# Patient Record
Sex: Male | Born: 1940
Health system: Southern US, Community
[De-identification: ages and names within clinical notes are randomized; demographics above are authoritative.]

## PROBLEM LIST (undated history)

## (undated) DIAGNOSIS — I10 Essential (primary) hypertension: Secondary | ICD-10-CM

## (undated) DIAGNOSIS — E785 Hyperlipidemia, unspecified: Secondary | ICD-10-CM

## (undated) DIAGNOSIS — N500 Atrophy of testis: Secondary | ICD-10-CM

## (undated) DIAGNOSIS — C439 Malignant melanoma of skin, unspecified: Secondary | ICD-10-CM

## (undated) DIAGNOSIS — N200 Calculus of kidney: Secondary | ICD-10-CM

## (undated) DIAGNOSIS — K5790 Diverticulosis of intestine, part unspecified, without perforation or abscess without bleeding: Secondary | ICD-10-CM

## (undated) HISTORY — DX: Hyperlipidemia, unspecified: E78.5

## (undated) HISTORY — DX: Diverticulosis of intestine, part unspecified, without perforation or abscess without bleeding: K57.90

## (undated) HISTORY — DX: Calculus of kidney: N20.0

## (undated) HISTORY — DX: Malignant melanoma of skin, unspecified: C43.9

## (undated) HISTORY — PX: APPENDECTOMY: SHX54

## (undated) HISTORY — DX: Atrophy of testis: N50.0

## (undated) HISTORY — DX: Essential (primary) hypertension: I10

---

## 1992-03-27 DIAGNOSIS — C439 Malignant melanoma of skin, unspecified: Secondary | ICD-10-CM

## 1992-03-27 HISTORY — DX: Malignant melanoma of skin, unspecified: C43.9

## 1992-08-08 DIAGNOSIS — C4491 Basal cell carcinoma of skin, unspecified: Secondary | ICD-10-CM

## 1992-08-08 HISTORY — DX: Basal cell carcinoma of skin, unspecified: C44.91

## 1996-05-13 DIAGNOSIS — C44619 Basal cell carcinoma of skin of left upper limb, including shoulder: Secondary | ICD-10-CM

## 1996-05-13 HISTORY — DX: Basal cell carcinoma of skin of left upper limb, including shoulder: C44.619

## 1997-06-28 DIAGNOSIS — C4492 Squamous cell carcinoma of skin, unspecified: Secondary | ICD-10-CM

## 1997-06-28 DIAGNOSIS — C4490 Unspecified malignant neoplasm of skin, unspecified: Secondary | ICD-10-CM

## 1997-06-28 HISTORY — DX: Squamous cell carcinoma of skin, unspecified: C44.92

## 1997-06-28 HISTORY — DX: Unspecified malignant neoplasm of skin, unspecified: C44.90

## 1997-10-27 DIAGNOSIS — C4491 Basal cell carcinoma of skin, unspecified: Secondary | ICD-10-CM

## 1997-10-27 HISTORY — DX: Basal cell carcinoma of skin, unspecified: C44.91

## 1999-06-21 ENCOUNTER — Ambulatory Visit (HOSPITAL_COMMUNITY): Admission: RE | Admit: 1999-06-21 | Discharge: 1999-06-21 | Payer: Self-pay | Admitting: Gastroenterology

## 2000-03-04 DIAGNOSIS — C44519 Basal cell carcinoma of skin of other part of trunk: Secondary | ICD-10-CM

## 2000-03-04 HISTORY — DX: Basal cell carcinoma of skin of other part of trunk: C44.519

## 2003-07-25 ENCOUNTER — Emergency Department (HOSPITAL_COMMUNITY): Admission: EM | Admit: 2003-07-25 | Discharge: 2003-07-25 | Payer: Self-pay | Admitting: Emergency Medicine

## 2003-08-10 ENCOUNTER — Ambulatory Visit (HOSPITAL_COMMUNITY): Admission: RE | Admit: 2003-08-10 | Discharge: 2003-08-10 | Payer: Self-pay | Admitting: Cardiology

## 2004-01-04 DIAGNOSIS — D229 Melanocytic nevi, unspecified: Secondary | ICD-10-CM

## 2004-01-04 HISTORY — DX: Melanocytic nevi, unspecified: D22.9

## 2009-06-28 ENCOUNTER — Encounter: Admission: RE | Admit: 2009-06-28 | Discharge: 2009-06-28 | Payer: Self-pay | Admitting: Otolaryngology

## 2010-06-15 NOTE — Procedures (Signed)
[place]. [facility]  Patient:    Jaime Waters, Jaime Waters                MRN: [id number] Proc. Date: 06/21/99 Adm. Date:  [id number] Disc. Date: [id number] Attending:  Orland Mustard CC:         Jamesetta Geralds, M.D.                           Procedure Report  PROCEDURE:  Colonoscopy.  MEDICATIONS:  Fentanyl 85 mcg and Versed 7 mg IV.  INDICATIONS FOR PROCEDURE:  Heme positive stools in a 70 year old gentleman.  DESCRIPTION OF PROCEDURE:  The procedure had been explained to the patient and consent obtained.  With the patient in the left lateral decubitus position, the adult Olympus video colonoscope was inserted and advanced under direct visualization.  The prep was quite good.  Using a small amount of abdominal pressure, we were able to advance rapidly to the cecum.  The ileocecal valve and appendiceal orifice were identified.  The scope was withdrawn.  the cecum, ascending colon, hepatic flexure, transverse colon, splenic flexure, descending and sigmoid colon were seen well upon removal.  No polyps were seen.  Moderate diverticulosis was seen in the sigmoid colon.  The scope was withdrawn down to the rectum.  No polyps were seen in the rectum.  The patient did have small internal hemorrhoids.  The patient tolerated the procedure well, was maintained on low flow oxygen and pulse oximeter throughout the procedure with no obvious problems.  ASSESSMENT: 1. Heme positive stool with no obvious cause on colonoscopy. 2. Diverticulosis. 3. Internal hemorrhoids.  PLAN:  Will follow back in the office in 3-4 weeks and recheck his stool. DD:  06/21/99 TD:  06/25/99 Job: [id number]

## 2012-06-23 DIAGNOSIS — N500 Atrophy of testis: Secondary | ICD-10-CM | POA: Diagnosis not present

## 2012-08-13 DIAGNOSIS — H35379 Puckering of macula, unspecified eye: Secondary | ICD-10-CM | POA: Diagnosis not present

## 2012-08-18 DIAGNOSIS — L570 Actinic keratosis: Secondary | ICD-10-CM | POA: Diagnosis not present

## 2012-08-18 DIAGNOSIS — B079 Viral wart, unspecified: Secondary | ICD-10-CM | POA: Diagnosis not present

## 2012-08-18 DIAGNOSIS — D485 Neoplasm of uncertain behavior of skin: Secondary | ICD-10-CM | POA: Diagnosis not present

## 2012-08-19 DIAGNOSIS — N402 Nodular prostate without lower urinary tract symptoms: Secondary | ICD-10-CM | POA: Diagnosis not present

## 2012-08-26 DIAGNOSIS — N500 Atrophy of testis: Secondary | ICD-10-CM | POA: Diagnosis not present

## 2012-08-26 DIAGNOSIS — N529 Male erectile dysfunction, unspecified: Secondary | ICD-10-CM | POA: Diagnosis not present

## 2012-08-26 DIAGNOSIS — N402 Nodular prostate without lower urinary tract symptoms: Secondary | ICD-10-CM | POA: Diagnosis not present

## 2012-08-26 DIAGNOSIS — N400 Enlarged prostate without lower urinary tract symptoms: Secondary | ICD-10-CM | POA: Diagnosis not present

## 2012-10-18 DIAGNOSIS — S82009A Unspecified fracture of unspecified patella, initial encounter for closed fracture: Secondary | ICD-10-CM | POA: Diagnosis not present

## 2012-10-19 DIAGNOSIS — M25569 Pain in unspecified knee: Secondary | ICD-10-CM | POA: Diagnosis not present

## 2012-10-19 DIAGNOSIS — S82009A Unspecified fracture of unspecified patella, initial encounter for closed fracture: Secondary | ICD-10-CM | POA: Diagnosis not present

## 2012-10-28 DIAGNOSIS — S82009A Unspecified fracture of unspecified patella, initial encounter for closed fracture: Secondary | ICD-10-CM | POA: Diagnosis not present

## 2012-11-10 DIAGNOSIS — Z23 Encounter for immunization: Secondary | ICD-10-CM | POA: Diagnosis not present

## 2012-11-11 DIAGNOSIS — S82009A Unspecified fracture of unspecified patella, initial encounter for closed fracture: Secondary | ICD-10-CM | POA: Diagnosis not present

## 2012-11-27 DIAGNOSIS — S2341XA Sprain of ribs, initial encounter: Secondary | ICD-10-CM | POA: Diagnosis not present

## 2012-11-27 DIAGNOSIS — S2239XA Fracture of one rib, unspecified side, initial encounter for closed fracture: Secondary | ICD-10-CM | POA: Diagnosis not present

## 2012-12-09 DIAGNOSIS — S82009A Unspecified fracture of unspecified patella, initial encounter for closed fracture: Secondary | ICD-10-CM | POA: Diagnosis not present

## 2012-12-15 DIAGNOSIS — M25569 Pain in unspecified knee: Secondary | ICD-10-CM | POA: Diagnosis not present

## 2012-12-15 DIAGNOSIS — S82009A Unspecified fracture of unspecified patella, initial encounter for closed fracture: Secondary | ICD-10-CM | POA: Diagnosis not present

## 2012-12-16 DIAGNOSIS — L570 Actinic keratosis: Secondary | ICD-10-CM | POA: Diagnosis not present

## 2012-12-16 DIAGNOSIS — D239 Other benign neoplasm of skin, unspecified: Secondary | ICD-10-CM | POA: Diagnosis not present

## 2012-12-17 DIAGNOSIS — M25569 Pain in unspecified knee: Secondary | ICD-10-CM | POA: Diagnosis not present

## 2012-12-17 DIAGNOSIS — S82009A Unspecified fracture of unspecified patella, initial encounter for closed fracture: Secondary | ICD-10-CM | POA: Diagnosis not present

## 2012-12-21 DIAGNOSIS — S82009A Unspecified fracture of unspecified patella, initial encounter for closed fracture: Secondary | ICD-10-CM | POA: Diagnosis not present

## 2012-12-21 DIAGNOSIS — M25569 Pain in unspecified knee: Secondary | ICD-10-CM | POA: Diagnosis not present

## 2012-12-30 DIAGNOSIS — M25569 Pain in unspecified knee: Secondary | ICD-10-CM | POA: Diagnosis not present

## 2013-01-18 DIAGNOSIS — S2239XA Fracture of one rib, unspecified side, initial encounter for closed fracture: Secondary | ICD-10-CM | POA: Diagnosis not present

## 2013-03-30 ENCOUNTER — Other Ambulatory Visit: Payer: Self-pay | Admitting: Family Medicine

## 2013-03-30 DIAGNOSIS — E7800 Pure hypercholesterolemia, unspecified: Secondary | ICD-10-CM | POA: Diagnosis not present

## 2013-03-30 DIAGNOSIS — E291 Testicular hypofunction: Secondary | ICD-10-CM | POA: Diagnosis not present

## 2013-03-30 DIAGNOSIS — I10 Essential (primary) hypertension: Secondary | ICD-10-CM | POA: Diagnosis not present

## 2013-03-30 DIAGNOSIS — Z23 Encounter for immunization: Secondary | ICD-10-CM | POA: Diagnosis not present

## 2013-03-30 DIAGNOSIS — Z0000 Encounter for general adult medical examination without abnormal findings: Secondary | ICD-10-CM | POA: Diagnosis not present

## 2013-03-30 DIAGNOSIS — S2239XA Fracture of one rib, unspecified side, initial encounter for closed fracture: Secondary | ICD-10-CM

## 2013-04-08 DIAGNOSIS — D0439 Carcinoma in situ of skin of other parts of face: Secondary | ICD-10-CM | POA: Diagnosis not present

## 2013-04-08 DIAGNOSIS — L820 Inflamed seborrheic keratosis: Secondary | ICD-10-CM | POA: Diagnosis not present

## 2013-04-08 DIAGNOSIS — D485 Neoplasm of uncertain behavior of skin: Secondary | ICD-10-CM | POA: Diagnosis not present

## 2013-04-08 DIAGNOSIS — D0430 Carcinoma in situ of skin of unspecified part of face: Secondary | ICD-10-CM | POA: Diagnosis not present

## 2013-04-08 DIAGNOSIS — C44529 Squamous cell carcinoma of skin of other part of trunk: Secondary | ICD-10-CM | POA: Diagnosis not present

## 2013-04-08 DIAGNOSIS — L570 Actinic keratosis: Secondary | ICD-10-CM | POA: Diagnosis not present

## 2013-04-16 ENCOUNTER — Ambulatory Visit
Admission: RE | Admit: 2013-04-16 | Discharge: 2013-04-16 | Disposition: A | Discharge status: Home / Self Care | Payer: Medicare Other | Source: Ambulatory Visit | Attending: Family Medicine | Admitting: Family Medicine

## 2013-04-16 DIAGNOSIS — M899 Disorder of bone, unspecified: Secondary | ICD-10-CM | POA: Diagnosis not present

## 2013-04-16 DIAGNOSIS — S2239XA Fracture of one rib, unspecified side, initial encounter for closed fracture: Secondary | ICD-10-CM

## 2013-05-13 DIAGNOSIS — D0430 Carcinoma in situ of skin of unspecified part of face: Secondary | ICD-10-CM | POA: Diagnosis not present

## 2013-05-13 DIAGNOSIS — D0439 Carcinoma in situ of skin of other parts of face: Secondary | ICD-10-CM | POA: Diagnosis not present

## 2013-05-13 DIAGNOSIS — D485 Neoplasm of uncertain behavior of skin: Secondary | ICD-10-CM | POA: Diagnosis not present

## 2013-05-13 DIAGNOSIS — C44529 Squamous cell carcinoma of skin of other part of trunk: Secondary | ICD-10-CM | POA: Diagnosis not present

## 2013-05-13 DIAGNOSIS — L820 Inflamed seborrheic keratosis: Secondary | ICD-10-CM | POA: Diagnosis not present

## 2013-05-13 DIAGNOSIS — L570 Actinic keratosis: Secondary | ICD-10-CM | POA: Diagnosis not present

## 2013-05-24 DIAGNOSIS — N400 Enlarged prostate without lower urinary tract symptoms: Secondary | ICD-10-CM | POA: Diagnosis not present

## 2013-05-31 DIAGNOSIS — N400 Enlarged prostate without lower urinary tract symptoms: Secondary | ICD-10-CM | POA: Diagnosis not present

## 2013-05-31 DIAGNOSIS — N402 Nodular prostate without lower urinary tract symptoms: Secondary | ICD-10-CM | POA: Diagnosis not present

## 2013-05-31 DIAGNOSIS — N529 Male erectile dysfunction, unspecified: Secondary | ICD-10-CM | POA: Diagnosis not present

## 2013-08-16 DIAGNOSIS — H43819 Vitreous degeneration, unspecified eye: Secondary | ICD-10-CM | POA: Diagnosis not present

## 2013-08-30 DIAGNOSIS — M25519 Pain in unspecified shoulder: Secondary | ICD-10-CM | POA: Diagnosis not present

## 2013-08-30 DIAGNOSIS — M25549 Pain in joints of unspecified hand: Secondary | ICD-10-CM | POA: Diagnosis not present

## 2013-08-30 DIAGNOSIS — S42023A Displaced fracture of shaft of unspecified clavicle, initial encounter for closed fracture: Secondary | ICD-10-CM | POA: Diagnosis not present

## 2013-09-01 DIAGNOSIS — L089 Local infection of the skin and subcutaneous tissue, unspecified: Secondary | ICD-10-CM | POA: Diagnosis not present

## 2013-09-01 DIAGNOSIS — L259 Unspecified contact dermatitis, unspecified cause: Secondary | ICD-10-CM | POA: Diagnosis not present

## 2013-09-10 DIAGNOSIS — M25519 Pain in unspecified shoulder: Secondary | ICD-10-CM | POA: Diagnosis not present

## 2013-10-01 DIAGNOSIS — M25539 Pain in unspecified wrist: Secondary | ICD-10-CM | POA: Diagnosis not present

## 2013-10-01 DIAGNOSIS — M19039 Primary osteoarthritis, unspecified wrist: Secondary | ICD-10-CM | POA: Diagnosis not present

## 2013-10-01 DIAGNOSIS — M25519 Pain in unspecified shoulder: Secondary | ICD-10-CM | POA: Diagnosis not present

## 2013-10-15 DIAGNOSIS — M25519 Pain in unspecified shoulder: Secondary | ICD-10-CM | POA: Diagnosis not present

## 2013-10-18 DIAGNOSIS — Z23 Encounter for immunization: Secondary | ICD-10-CM | POA: Diagnosis not present

## 2013-10-18 DIAGNOSIS — I10 Essential (primary) hypertension: Secondary | ICD-10-CM | POA: Diagnosis not present

## 2013-10-19 DIAGNOSIS — M25519 Pain in unspecified shoulder: Secondary | ICD-10-CM | POA: Diagnosis not present

## 2013-10-21 DIAGNOSIS — M25519 Pain in unspecified shoulder: Secondary | ICD-10-CM | POA: Diagnosis not present

## 2013-10-26 DIAGNOSIS — M25519 Pain in unspecified shoulder: Secondary | ICD-10-CM | POA: Diagnosis not present

## 2013-10-28 DIAGNOSIS — M25511 Pain in right shoulder: Secondary | ICD-10-CM | POA: Diagnosis not present

## 2013-10-29 DIAGNOSIS — S42024D Nondisplaced fracture of shaft of right clavicle, subsequent encounter for fracture with routine healing: Secondary | ICD-10-CM | POA: Diagnosis not present

## 2013-10-29 DIAGNOSIS — M19031 Primary osteoarthritis, right wrist: Secondary | ICD-10-CM | POA: Diagnosis not present

## 2013-10-29 DIAGNOSIS — M25511 Pain in right shoulder: Secondary | ICD-10-CM | POA: Diagnosis not present

## 2013-11-02 DIAGNOSIS — M25511 Pain in right shoulder: Secondary | ICD-10-CM | POA: Diagnosis not present

## 2013-11-04 DIAGNOSIS — M25511 Pain in right shoulder: Secondary | ICD-10-CM | POA: Diagnosis not present

## 2013-11-09 DIAGNOSIS — M25511 Pain in right shoulder: Secondary | ICD-10-CM | POA: Diagnosis not present

## 2013-11-11 DIAGNOSIS — M25511 Pain in right shoulder: Secondary | ICD-10-CM | POA: Diagnosis not present

## 2013-11-16 DIAGNOSIS — M25511 Pain in right shoulder: Secondary | ICD-10-CM | POA: Diagnosis not present

## 2013-11-18 DIAGNOSIS — M25511 Pain in right shoulder: Secondary | ICD-10-CM | POA: Diagnosis not present

## 2013-11-23 DIAGNOSIS — M25511 Pain in right shoulder: Secondary | ICD-10-CM | POA: Diagnosis not present

## 2013-11-25 DIAGNOSIS — M25511 Pain in right shoulder: Secondary | ICD-10-CM | POA: Diagnosis not present

## 2013-11-29 DIAGNOSIS — S42024D Nondisplaced fracture of shaft of right clavicle, subsequent encounter for fracture with routine healing: Secondary | ICD-10-CM | POA: Diagnosis not present

## 2013-11-29 DIAGNOSIS — M19031 Primary osteoarthritis, right wrist: Secondary | ICD-10-CM | POA: Diagnosis not present

## 2013-11-30 DIAGNOSIS — M25511 Pain in right shoulder: Secondary | ICD-10-CM | POA: Diagnosis not present

## 2013-12-02 DIAGNOSIS — M25511 Pain in right shoulder: Secondary | ICD-10-CM | POA: Diagnosis not present

## 2013-12-07 DIAGNOSIS — M25511 Pain in right shoulder: Secondary | ICD-10-CM | POA: Diagnosis not present

## 2013-12-09 DIAGNOSIS — M25511 Pain in right shoulder: Secondary | ICD-10-CM | POA: Diagnosis not present

## 2013-12-14 DIAGNOSIS — M25511 Pain in right shoulder: Secondary | ICD-10-CM | POA: Diagnosis not present

## 2013-12-16 DIAGNOSIS — M25511 Pain in right shoulder: Secondary | ICD-10-CM | POA: Diagnosis not present

## 2013-12-20 DIAGNOSIS — M25511 Pain in right shoulder: Secondary | ICD-10-CM | POA: Diagnosis not present

## 2013-12-21 DIAGNOSIS — M25511 Pain in right shoulder: Secondary | ICD-10-CM | POA: Diagnosis not present

## 2013-12-28 DIAGNOSIS — M25511 Pain in right shoulder: Secondary | ICD-10-CM | POA: Diagnosis not present

## 2013-12-30 DIAGNOSIS — M25511 Pain in right shoulder: Secondary | ICD-10-CM | POA: Diagnosis not present

## 2014-01-03 DIAGNOSIS — L570 Actinic keratosis: Secondary | ICD-10-CM | POA: Diagnosis not present

## 2014-01-03 DIAGNOSIS — D485 Neoplasm of uncertain behavior of skin: Secondary | ICD-10-CM | POA: Diagnosis not present

## 2014-01-03 DIAGNOSIS — D225 Melanocytic nevi of trunk: Secondary | ICD-10-CM | POA: Diagnosis not present

## 2014-01-04 DIAGNOSIS — M25511 Pain in right shoulder: Secondary | ICD-10-CM | POA: Diagnosis not present

## 2014-01-05 DIAGNOSIS — M25511 Pain in right shoulder: Secondary | ICD-10-CM | POA: Diagnosis not present

## 2014-01-05 DIAGNOSIS — M19031 Primary osteoarthritis, right wrist: Secondary | ICD-10-CM | POA: Diagnosis not present

## 2014-01-06 DIAGNOSIS — M25511 Pain in right shoulder: Secondary | ICD-10-CM | POA: Diagnosis not present

## 2014-01-11 DIAGNOSIS — M25511 Pain in right shoulder: Secondary | ICD-10-CM | POA: Diagnosis not present

## 2014-01-13 DIAGNOSIS — M25511 Pain in right shoulder: Secondary | ICD-10-CM | POA: Diagnosis not present

## 2014-01-18 DIAGNOSIS — M25511 Pain in right shoulder: Secondary | ICD-10-CM | POA: Diagnosis not present

## 2014-01-20 DIAGNOSIS — M25511 Pain in right shoulder: Secondary | ICD-10-CM | POA: Diagnosis not present

## 2014-04-01 DIAGNOSIS — L97919 Non-pressure chronic ulcer of unspecified part of right lower leg with unspecified severity: Secondary | ICD-10-CM | POA: Diagnosis not present

## 2014-04-01 DIAGNOSIS — D485 Neoplasm of uncertain behavior of skin: Secondary | ICD-10-CM | POA: Diagnosis not present

## 2014-04-05 DIAGNOSIS — Z136 Encounter for screening for cardiovascular disorders: Secondary | ICD-10-CM | POA: Diagnosis not present

## 2014-04-05 DIAGNOSIS — Z0000 Encounter for general adult medical examination without abnormal findings: Secondary | ICD-10-CM | POA: Diagnosis not present

## 2014-04-05 DIAGNOSIS — I10 Essential (primary) hypertension: Secondary | ICD-10-CM | POA: Diagnosis not present

## 2014-05-30 DIAGNOSIS — N400 Enlarged prostate without lower urinary tract symptoms: Secondary | ICD-10-CM | POA: Diagnosis not present

## 2014-06-01 DIAGNOSIS — N402 Nodular prostate without lower urinary tract symptoms: Secondary | ICD-10-CM | POA: Diagnosis not present

## 2014-06-01 DIAGNOSIS — N5201 Erectile dysfunction due to arterial insufficiency: Secondary | ICD-10-CM | POA: Diagnosis not present

## 2014-08-22 DIAGNOSIS — H35372 Puckering of macula, left eye: Secondary | ICD-10-CM | POA: Diagnosis not present

## 2014-09-28 DIAGNOSIS — D485 Neoplasm of uncertain behavior of skin: Secondary | ICD-10-CM | POA: Diagnosis not present

## 2014-09-28 DIAGNOSIS — D045 Carcinoma in situ of skin of trunk: Secondary | ICD-10-CM | POA: Diagnosis not present

## 2014-09-28 DIAGNOSIS — L570 Actinic keratosis: Secondary | ICD-10-CM | POA: Diagnosis not present

## 2014-10-20 DIAGNOSIS — D045 Carcinoma in situ of skin of trunk: Secondary | ICD-10-CM | POA: Diagnosis not present

## 2014-11-11 DIAGNOSIS — Z23 Encounter for immunization: Secondary | ICD-10-CM | POA: Diagnosis not present

## 2015-01-10 DIAGNOSIS — D0472 Carcinoma in situ of skin of left lower limb, including hip: Secondary | ICD-10-CM | POA: Diagnosis not present

## 2015-01-10 DIAGNOSIS — D0460 Carcinoma in situ of skin of unspecified upper limb, including shoulder: Secondary | ICD-10-CM | POA: Diagnosis not present

## 2015-01-10 DIAGNOSIS — D0462 Carcinoma in situ of skin of left upper limb, including shoulder: Secondary | ICD-10-CM | POA: Diagnosis not present

## 2015-01-10 DIAGNOSIS — L570 Actinic keratosis: Secondary | ICD-10-CM | POA: Diagnosis not present

## 2015-01-10 DIAGNOSIS — D0470 Carcinoma in situ of skin of unspecified lower limb, including hip: Secondary | ICD-10-CM | POA: Diagnosis not present

## 2015-02-24 DIAGNOSIS — M542 Cervicalgia: Secondary | ICD-10-CM | POA: Diagnosis not present

## 2015-02-28 DIAGNOSIS — M5013 Cervical disc disorder with radiculopathy, cervicothoracic region: Secondary | ICD-10-CM | POA: Diagnosis not present

## 2015-02-28 DIAGNOSIS — M542 Cervicalgia: Secondary | ICD-10-CM | POA: Diagnosis not present

## 2015-03-03 DIAGNOSIS — H6691 Otitis media, unspecified, right ear: Secondary | ICD-10-CM | POA: Diagnosis not present

## 2015-03-03 DIAGNOSIS — M542 Cervicalgia: Secondary | ICD-10-CM | POA: Diagnosis not present

## 2015-03-03 DIAGNOSIS — I10 Essential (primary) hypertension: Secondary | ICD-10-CM | POA: Diagnosis not present

## 2015-03-03 DIAGNOSIS — M5013 Cervical disc disorder with radiculopathy, cervicothoracic region: Secondary | ICD-10-CM | POA: Diagnosis not present

## 2015-03-03 DIAGNOSIS — J209 Acute bronchitis, unspecified: Secondary | ICD-10-CM | POA: Diagnosis not present

## 2015-03-29 DIAGNOSIS — M5013 Cervical disc disorder with radiculopathy, cervicothoracic region: Secondary | ICD-10-CM | POA: Diagnosis not present

## 2015-03-29 DIAGNOSIS — M542 Cervicalgia: Secondary | ICD-10-CM | POA: Diagnosis not present

## 2015-03-31 DIAGNOSIS — M5013 Cervical disc disorder with radiculopathy, cervicothoracic region: Secondary | ICD-10-CM | POA: Diagnosis not present

## 2015-04-12 DIAGNOSIS — Z0000 Encounter for general adult medical examination without abnormal findings: Secondary | ICD-10-CM | POA: Diagnosis not present

## 2015-04-12 DIAGNOSIS — E7800 Pure hypercholesterolemia, unspecified: Secondary | ICD-10-CM | POA: Diagnosis not present

## 2015-04-12 DIAGNOSIS — I10 Essential (primary) hypertension: Secondary | ICD-10-CM | POA: Diagnosis not present

## 2015-06-08 DIAGNOSIS — D0472 Carcinoma in situ of skin of left lower limb, including hip: Secondary | ICD-10-CM | POA: Diagnosis not present

## 2015-06-08 DIAGNOSIS — C4442 Squamous cell carcinoma of skin of scalp and neck: Secondary | ICD-10-CM | POA: Diagnosis not present

## 2015-06-08 DIAGNOSIS — N402 Nodular prostate without lower urinary tract symptoms: Secondary | ICD-10-CM | POA: Diagnosis not present

## 2015-06-08 DIAGNOSIS — D044 Carcinoma in situ of skin of scalp and neck: Secondary | ICD-10-CM | POA: Diagnosis not present

## 2015-06-08 DIAGNOSIS — D0471 Carcinoma in situ of skin of right lower limb, including hip: Secondary | ICD-10-CM | POA: Diagnosis not present

## 2015-06-08 DIAGNOSIS — L570 Actinic keratosis: Secondary | ICD-10-CM | POA: Diagnosis not present

## 2015-06-16 DIAGNOSIS — R3129 Other microscopic hematuria: Secondary | ICD-10-CM | POA: Diagnosis not present

## 2015-06-16 DIAGNOSIS — Z0000 Encounter for general adult medical examination without abnormal findings: Secondary | ICD-10-CM | POA: Diagnosis not present

## 2015-06-16 DIAGNOSIS — N400 Enlarged prostate without lower urinary tract symptoms: Secondary | ICD-10-CM | POA: Diagnosis not present

## 2015-06-16 DIAGNOSIS — N390 Urinary tract infection, site not specified: Secondary | ICD-10-CM | POA: Diagnosis not present

## 2015-06-22 DIAGNOSIS — N200 Calculus of kidney: Secondary | ICD-10-CM | POA: Diagnosis not present

## 2015-06-22 DIAGNOSIS — R3129 Other microscopic hematuria: Secondary | ICD-10-CM | POA: Diagnosis not present

## 2015-07-13 DIAGNOSIS — C44529 Squamous cell carcinoma of skin of other part of trunk: Secondary | ICD-10-CM | POA: Diagnosis not present

## 2015-07-13 DIAGNOSIS — C4442 Squamous cell carcinoma of skin of scalp and neck: Secondary | ICD-10-CM | POA: Diagnosis not present

## 2015-07-13 DIAGNOSIS — D044 Carcinoma in situ of skin of scalp and neck: Secondary | ICD-10-CM | POA: Diagnosis not present

## 2015-07-13 DIAGNOSIS — C44329 Squamous cell carcinoma of skin of other parts of face: Secondary | ICD-10-CM | POA: Diagnosis not present

## 2015-08-09 DIAGNOSIS — N402 Nodular prostate without lower urinary tract symptoms: Secondary | ICD-10-CM | POA: Diagnosis not present

## 2015-08-09 DIAGNOSIS — R311 Benign essential microscopic hematuria: Secondary | ICD-10-CM | POA: Diagnosis not present

## 2015-08-09 DIAGNOSIS — N500 Atrophy of testis: Secondary | ICD-10-CM | POA: Diagnosis not present

## 2015-08-22 DIAGNOSIS — H2513 Age-related nuclear cataract, bilateral: Secondary | ICD-10-CM | POA: Diagnosis not present

## 2015-09-07 ENCOUNTER — Encounter: Payer: Self-pay | Admitting: Cardiology

## 2015-09-07 ENCOUNTER — Encounter (INDEPENDENT_AMBULATORY_CARE_PROVIDER_SITE_OTHER): Payer: Self-pay

## 2015-09-07 ENCOUNTER — Ambulatory Visit (INDEPENDENT_AMBULATORY_CARE_PROVIDER_SITE_OTHER): Payer: Medicare Other | Admitting: Cardiology

## 2015-09-07 VITALS — BP 124/72 | HR 75 | Ht 66.0 in | Wt 123.6 lb

## 2015-09-07 DIAGNOSIS — E785 Hyperlipidemia, unspecified: Secondary | ICD-10-CM | POA: Insufficient documentation

## 2015-09-07 DIAGNOSIS — R0789 Other chest pain: Secondary | ICD-10-CM | POA: Diagnosis not present

## 2015-09-07 DIAGNOSIS — C439 Malignant melanoma of skin, unspecified: Secondary | ICD-10-CM | POA: Insufficient documentation

## 2015-09-07 DIAGNOSIS — N500 Atrophy of testis: Secondary | ICD-10-CM | POA: Insufficient documentation

## 2015-09-07 DIAGNOSIS — R079 Chest pain, unspecified: Secondary | ICD-10-CM | POA: Diagnosis not present

## 2015-09-07 DIAGNOSIS — I10 Essential (primary) hypertension: Secondary | ICD-10-CM

## 2015-09-07 DIAGNOSIS — N200 Calculus of kidney: Secondary | ICD-10-CM | POA: Insufficient documentation

## 2015-09-07 DIAGNOSIS — K5790 Diverticulosis of intestine, part unspecified, without perforation or abscess without bleeding: Secondary | ICD-10-CM | POA: Insufficient documentation

## 2015-09-07 LAB — TROPONIN I

## 2015-09-07 MED ORDER — PANTOPRAZOLE SODIUM 40 MG PO TBEC: 40.0000 mg | DELAYED_RELEASE_TABLET | Freq: Every day | ORAL | 11 refills | Status: DC

## 2015-09-07 NOTE — Patient Instructions (Addendum)
Medication Instructions:  1) START PROTONIX 40 mg daily  Labwork: TODAY: TROPONIN  Testing/Procedures: Your physician has requested that you have an echocardiogram. Echocardiography is a painless test that uses sound waves to create images of your heart. It provides your doctor with information about the size and shape of your heart and how well your heart's chambers and valves are working. This procedure takes approximately one hour. There are no restrictions for this procedure.  Dr. Radford Pax recommends you have a NUCLEAR STRESS TEST.  Follow-Up: Your physician recommends that you schedule a follow-up appointment in 2 weeks with Dr. Theodosia Blender PA.  Any Other Special Instructions Will Be Listed Below (If Applicable).     If you need a refill on your cardiac medications before your next appointment, please call your pharmacy.

## 2015-09-07 NOTE — Progress Notes (Addendum)
Cardiology Office Note    Date:  09/07/2015   ID:  Jaime Waters, DOB [birth year], MRN [id number]  PCP:  No PCP Per Patient  Cardiologist:  Fransico Him, MD   Chief Complaint  Patient presents with  . Chest Pain  . Hypertension    History of Present Illness:  Jaime Waters is a 75 y.o. male who presents toady for evaluation of atypical chest pain.  He says that he was out playing golf and started having a feeling of a knot in the epigastrium that was constant.  The next day it was still present but then had moved into the left side of his chest. He has a hard time describing the sensation but says that it feels tight and feels like someone is pushing on his abdomen and chest.  He rides the elliptical for 6 miles daily without any change in the chest discomfort.  He denies any diaphoresis, SOB or nausea.  He has no DOE.  He started yesterday taking 8 gaviscon and 2 today but he doesn't think it helped any.  He has been belching a lot and momentarily will take the discomfort away.  He says that he feels like he has a lot of gas.  He denies any palpitations, LE edema, or dizziness.  He denies any tobacco use and no family history of CAD.      Past Medical History:  Diagnosis Date  . Benign essential HTN   . Diverticulosis   . Hyperlipidemia   . Kidney stone   . Melanoma ([place])   . Testicular atrophy     Past Surgical History:  Procedure Laterality Date  . APPENDECTOMY      Current Medications: No outpatient prescriptions prior to visit.   No facility-administered medications prior to visit.      Allergies:   Review of patient's allergies indicates not on file.   Social History   Social History  . Marital status: Married    Spouse name: N/A  . Number of children: N/A  . Years of education: N/A   Social History Main Topics  . Smoking status: Never Smoker  . Smokeless tobacco: None  . Alcohol use 1.8 oz/week    3 Glasses of wine per week  . Drug use:  Unknown  . Sexual activity: Not Asked   Other Topics Concern  . None   Social History Narrative  . None     Family History:  The patient's family history includes Dementia in his mother; Hypertension in his father; Pancreatic cancer in his father.   ROS:   Please see the history of present illness.    ROS All other systems reviewed and are negative.   PHYSICAL EXAM:   VS:  BP 124/72   Pulse 75   Ht 5\' 6"  (1.676 m)   Wt 123 lb 9.6 oz (56.1 kg)   SpO2 98%   BMI 19.95 kg/m    GEN: Well nourished, well developed, in no acute distress  HEENT: normal  Neck: no JVD, carotid bruits, or masses Cardiac: RRR; no murmurs, rubs, or gallops,no edema.  Intact distal pulses bilaterally.  Respiratory:  clear to auscultation bilaterally, normal work of breathing GI: soft, nontender, nondistended, + BS MS: no deformity or atrophy  Skin: warm and dry, no rash Neuro:  Alert and Oriented x 3, Strength and sensation are intact Psych: euthymic mood, full affect  Wt Readings from Last 3 Encounters:  09/07/15 123 lb 9.6 oz (56.1 kg)  Studies/Labs Reviewed:   EKG:  EKG is not ordered today.   Recent Labs: No results found for requested labs within last 8760 hours.   Lipid Panel No results found for: CHOL, TRIG, HDL, CHOLHDL, VLDL, LDLCALC, LDLDIRECT  Additional studies/ records that were reviewed today include:  EKG from PCP    ASSESSMENT:    1. Other chest pain   2. Benign essential HTN      PLAN:  In order of problems listed above:  1. HTN - BP controlled on current meds.  Continue amlodipine and diuretic.  2. Chest pain - it is atypical and has been constant for 2 days.  It is improved with belching but comes right back.  It started in his epigastric area and is not worse with exertion, movement, deep breathing or changes in position.  He still rides his elliptical daily without any problems.  With his history of daily alcohol use this may be gastritis.  I will start  him on Protonix 40mg  daily and have him followup with my PA in 2 weeks.  If symptoms are not improved I will get a gallbladder US. I will check a stress myoview to rule out ischemia and check a 2D echo to assess LVF.      Medication Adjustments/Labs and Tests Ordered: Current medicines are reviewed at length with the patient today.  Concerns regarding medicines are outlined above.  Medication changes, Labs and Tests ordered today are listed in the Patient Instructions below.  Patient Instructions  Medication Instructions:  1) START PROTONIX 40 mg daily  Labwork: TODAY: TROPONIN  Testing/Procedures: Your physician has requested that you have an echocardiogram. Echocardiography is a painless test that uses sound waves to create images of your heart. It provides your doctor with information about the size and shape of your heart and how well your heart's chambers and valves are working. This procedure takes approximately one hour. There are no restrictions for this procedure.  Dr. Radford Pax recommends you have a NUCLEAR STRESS TEST.  Follow-Up: Your physician recommends that you schedule a follow-up appointment in 2 weeks with Dr. Theodosia Blender PA.  Any Other Special Instructions Will Be Listed Below (If Applicable).     If you need a refill on your cardiac medications before your next appointment, please call your pharmacy.      Signed, Fransico Him, MD  09/07/2015 4:15 PM    [facility] [address], [place], [place]  [id number] Phone: [phone]; Fax: [phone]

## 2015-09-11 ENCOUNTER — Encounter: Payer: Self-pay | Admitting: Cardiology

## 2015-09-18 ENCOUNTER — Telehealth (HOSPITAL_COMMUNITY): Payer: Self-pay | Admitting: *Deleted

## 2015-09-18 NOTE — Telephone Encounter (Signed)
Patient given detailed instructions per Myocardial Perfusion Study Information Sheet for the test on 09/20/15 at 0745. Patient notified to arrive 15 minutes early and that it is imperative to arrive on time for appointment to keep from having the test rescheduled.  If you need to cancel or reschedule your appointment, please call the office within 24 hours of your appointment. Failure to do so may result in a cancellation of your appointment, and a $50 no show fee. Patient verbalized understanding.Shanyia Stines, Ranae Palms

## 2015-09-20 ENCOUNTER — Other Ambulatory Visit: Payer: Self-pay

## 2015-09-20 ENCOUNTER — Ambulatory Visit (HOSPITAL_BASED_OUTPATIENT_CLINIC_OR_DEPARTMENT_OTHER): Payer: Medicare Other

## 2015-09-20 ENCOUNTER — Ambulatory Visit (HOSPITAL_COMMUNITY): Payer: Medicare Other | Attending: Cardiology

## 2015-09-20 DIAGNOSIS — R079 Chest pain, unspecified: Secondary | ICD-10-CM | POA: Diagnosis present

## 2015-09-20 DIAGNOSIS — E785 Hyperlipidemia, unspecified: Secondary | ICD-10-CM | POA: Diagnosis not present

## 2015-09-20 DIAGNOSIS — I119 Hypertensive heart disease without heart failure: Secondary | ICD-10-CM | POA: Insufficient documentation

## 2015-09-20 DIAGNOSIS — I351 Nonrheumatic aortic (valve) insufficiency: Secondary | ICD-10-CM | POA: Insufficient documentation

## 2015-09-20 DIAGNOSIS — R0789 Other chest pain: Secondary | ICD-10-CM | POA: Diagnosis not present

## 2015-09-20 DIAGNOSIS — I340 Nonrheumatic mitral (valve) insufficiency: Secondary | ICD-10-CM | POA: Insufficient documentation

## 2015-09-20 LAB — MYOCARDIAL PERFUSION IMAGING
CHL CUP NUCLEAR SDS: 1
CHL CUP NUCLEAR SRS: 4
CHL CUP NUCLEAR SSS: 5
CHL RATE OF PERCEIVED EXERTION: 17
CSEPED: 9 min
CSEPEDS: 0 s
CSEPEW: 10.1 METS
CSEPPHR: 134 {beats}/min
LV dias vol: 120 mL (ref 62–150)
LVSYSVOL: 63 mL
MPHR: 146 {beats}/min
NUC STRESS TID: 0.98
Percent HR: 91 %
RATE: 0.31
Rest HR: 57 {beats}/min

## 2015-09-20 MED ORDER — TECHNETIUM TC 99M TETROFOSMIN IV KIT
32.6000 | PACK | Freq: Once | INTRAVENOUS | Status: AC | PRN
  Administered 2015-09-20: 33 via INTRAVENOUS
  Filled 2015-09-20: qty 33

## 2015-09-20 MED ORDER — TECHNETIUM TC 99M TETROFOSMIN IV KIT
10.7000 | PACK | Freq: Once | INTRAVENOUS | Status: AC | PRN
  Administered 2015-09-20: 11 via INTRAVENOUS
  Filled 2015-09-20: qty 11

## 2015-09-25 ENCOUNTER — Ambulatory Visit (INDEPENDENT_AMBULATORY_CARE_PROVIDER_SITE_OTHER): Payer: Medicare Other | Admitting: Cardiology

## 2015-09-25 ENCOUNTER — Encounter: Payer: Self-pay | Admitting: Cardiology

## 2015-09-25 VITALS — BP 152/81 | HR 70 | Ht 66.0 in | Wt 125.2 lb

## 2015-09-25 DIAGNOSIS — R0789 Other chest pain: Secondary | ICD-10-CM | POA: Diagnosis not present

## 2015-09-25 NOTE — Progress Notes (Signed)
09/25/2015 Jaime Waters   03-Jul-1940  [id number]  Primary Physician Aretta Nip, MD Primary Cardiologist: Dr. Radford Pax   Reason for Visit/CC: F/U for atypical chest pain  HPI:  The patient is a 75 year old male, followed by Dr. Radford Pax, who presents to clinic today for evaluation for atypical chest pain. He was recently seen by Dr. Radford Pax on 09/07/2015. At that appointment he described atypical symptoms described as "a feeling of a knot in the epigastrium". Also feels tight and feels like someone is pushing on his abdomen and chest.  He rides the elliptical for 6 miles daily without any change in the chest discomfort.  He denies any diaphoresis, SOB or nausea.  He has no DOE. Symptoms relieved momentarily with belching.  He has no h/o CAD but does have a h/o ETOH use and gastritis. Dr. Radford Pax felt that his symptoms were perhaps GI in etiology and prescribed Protonix 40 mg.  She also ordered a NST and 2D echo to r/o cardiac etiologies. Both studies were normal. NST was negative for ischemia/ low risk. 2D echo showed normal LVEF of 50-55% with G2DD and trivial MR/AR.  Dr. Radford Pax instructed him to f/u in 2 weeks to reassess symptoms. If no improvement, she outlined that he should get a gallbladder scan.  Today in clinic, he reports that he has done well. He took Protonix for 14 days and did not notice any improvement with it so he stopped it. Several days after stopping, his discomfort went away. He denies any recurrent symptoms. No recurrent CP. Still exercising on the elliptical w/o exertional pain and no dyspnea. He does not want any further w/u.    Current Outpatient Prescriptions  Medication Sig Dispense Refill  . amLODipine (NORVASC) 10 MG tablet Take 1 tablet by mouth daily.    Marland Kitchen aspirin 81 MG tablet Take 81 mg by mouth daily.    . hydrochlorothiazide (HYDRODIURIL) 25 MG tablet Take 1 tablet by mouth daily.     No current facility-administered medications for this visit.      Not on File  Social History   Social History  . Marital status: Married    Spouse name: N/A  . Number of children: N/A  . Years of education: N/A   Occupational History  . Not on file.   Social History Main Topics  . Smoking status: Never Smoker  . Smokeless tobacco: Never Used  . Alcohol use 1.8 oz/week    3 Glasses of wine per week  . Drug use: Unknown  . Sexual activity: Not on file   Other Topics Concern  . Not on file   Social History Narrative  . No narrative on file     Review of Systems: General: negative for chills, fever, night sweats or weight changes.  Cardiovascular: negative for chest pain, dyspnea on exertion, edema, orthopnea, palpitations, paroxysmal nocturnal dyspnea or shortness of breath Dermatological: negative for rash Respiratory: negative for cough or wheezing Urologic: negative for hematuria Abdominal: negative for nausea, vomiting, diarrhea, bright red blood per rectum, melena, or hematemesis Neurologic: negative for visual changes, syncope, or dizziness All other systems reviewed and are otherwise negative except as noted above.    Blood pressure (!) 152/81, pulse 70, height 5\' 6"  (1.676 m), weight 125 lb 3.2 oz (56.8 kg), SpO2 98 %.  General appearance: alert, cooperative and no distress Neck: no carotid bruit, no JVD and thyroid not enlarged, symmetric, no tenderness/mass/nodules Lungs: clear to auscultation bilaterally Heart: regular rate and rhythm,  S1, S2 normal, no murmur, click, rub or gallop Extremities: no LEE Pulses: 2+ and symmetric Skin: Skin color, texture, turgor normal. No rashes or lesions Neurologic: Grossly normal  EKG not performed.   ASSESSMENT AND PLAN:   1. Atypical CP: no noticeable improvement with Protonix, so patient self discontinued however he did eventually have resolution of symptoms. No recurrent chest pain. No exertional symptoms. We discussed plans for abdominal ultrasound, however since he is now  asymptomatic will not order at this time. He is in agreement. He will continue to f/u with Dr. Radford Pax as needed.   2. HTN: his BP is a bit elevated this am, but he just took his am meds (amlodipine and HCTZ <30 min ago). He states that it is usually well controlled in the 120s at home. He has a BP monitor at home. Patient advised to monitor closely at home and to call us or f/u with his PCP if persistent systolic pressure readings >140. He verbalized understanding.    Nimai Burbach PA-C 09/25/2015 8:25 AM

## 2015-09-25 NOTE — Patient Instructions (Signed)
Your physician recommends that you schedule a follow-up appointment as needed with Dr.Turner.

## 2015-11-17 DIAGNOSIS — Z23 Encounter for immunization: Secondary | ICD-10-CM | POA: Diagnosis not present

## 2016-01-08 DIAGNOSIS — H43813 Vitreous degeneration, bilateral: Secondary | ICD-10-CM | POA: Diagnosis not present

## 2016-01-15 DIAGNOSIS — C44111 Basal cell carcinoma of skin of unspecified eyelid, including canthus: Secondary | ICD-10-CM | POA: Diagnosis not present

## 2016-01-15 DIAGNOSIS — C44119 Basal cell carcinoma of skin of left eyelid, including canthus: Secondary | ICD-10-CM | POA: Diagnosis not present

## 2016-01-15 DIAGNOSIS — L309 Dermatitis, unspecified: Secondary | ICD-10-CM | POA: Diagnosis not present

## 2016-01-15 DIAGNOSIS — C4491 Basal cell carcinoma of skin, unspecified: Secondary | ICD-10-CM

## 2016-01-15 DIAGNOSIS — D229 Melanocytic nevi, unspecified: Secondary | ICD-10-CM | POA: Diagnosis not present

## 2016-01-15 DIAGNOSIS — L570 Actinic keratosis: Secondary | ICD-10-CM | POA: Diagnosis not present

## 2016-01-15 HISTORY — DX: Basal cell carcinoma of skin, unspecified: C44.91

## 2016-01-30 DIAGNOSIS — C44119 Basal cell carcinoma of skin of left eyelid, including canthus: Secondary | ICD-10-CM | POA: Diagnosis not present

## 2016-04-22 DIAGNOSIS — N529 Male erectile dysfunction, unspecified: Secondary | ICD-10-CM | POA: Diagnosis not present

## 2016-04-22 DIAGNOSIS — I10 Essential (primary) hypertension: Secondary | ICD-10-CM | POA: Diagnosis not present

## 2016-04-22 DIAGNOSIS — Z23 Encounter for immunization: Secondary | ICD-10-CM | POA: Diagnosis not present

## 2016-04-22 DIAGNOSIS — E7800 Pure hypercholesterolemia, unspecified: Secondary | ICD-10-CM | POA: Diagnosis not present

## 2016-04-22 DIAGNOSIS — Z0000 Encounter for general adult medical examination without abnormal findings: Secondary | ICD-10-CM | POA: Diagnosis not present

## 2016-05-22 DIAGNOSIS — Z85820 Personal history of malignant melanoma of skin: Secondary | ICD-10-CM | POA: Diagnosis not present

## 2016-05-22 DIAGNOSIS — L570 Actinic keratosis: Secondary | ICD-10-CM | POA: Diagnosis not present

## 2016-05-22 DIAGNOSIS — D229 Melanocytic nevi, unspecified: Secondary | ICD-10-CM | POA: Diagnosis not present

## 2016-08-01 DIAGNOSIS — R42 Dizziness and giddiness: Secondary | ICD-10-CM | POA: Diagnosis not present

## 2016-08-06 DIAGNOSIS — D229 Melanocytic nevi, unspecified: Secondary | ICD-10-CM | POA: Diagnosis not present

## 2016-08-06 DIAGNOSIS — Z85820 Personal history of malignant melanoma of skin: Secondary | ICD-10-CM | POA: Diagnosis not present

## 2016-08-06 DIAGNOSIS — D492 Neoplasm of unspecified behavior of bone, soft tissue, and skin: Secondary | ICD-10-CM | POA: Diagnosis not present

## 2016-08-06 DIAGNOSIS — L570 Actinic keratosis: Secondary | ICD-10-CM | POA: Diagnosis not present

## 2016-08-16 DIAGNOSIS — N402 Nodular prostate without lower urinary tract symptoms: Secondary | ICD-10-CM | POA: Diagnosis not present

## 2016-08-16 DIAGNOSIS — R351 Nocturia: Secondary | ICD-10-CM | POA: Diagnosis not present

## 2016-08-16 DIAGNOSIS — N500 Atrophy of testis: Secondary | ICD-10-CM | POA: Diagnosis not present

## 2016-08-16 DIAGNOSIS — R3121 Asymptomatic microscopic hematuria: Secondary | ICD-10-CM | POA: Diagnosis not present

## 2016-08-16 DIAGNOSIS — N401 Enlarged prostate with lower urinary tract symptoms: Secondary | ICD-10-CM | POA: Diagnosis not present

## 2016-09-24 DIAGNOSIS — H2513 Age-related nuclear cataract, bilateral: Secondary | ICD-10-CM | POA: Diagnosis not present

## 2016-10-02 DIAGNOSIS — C44319 Basal cell carcinoma of skin of other parts of face: Secondary | ICD-10-CM | POA: Diagnosis not present

## 2016-10-02 DIAGNOSIS — C44321 Squamous cell carcinoma of skin of nose: Secondary | ICD-10-CM | POA: Diagnosis not present

## 2016-10-02 DIAGNOSIS — L570 Actinic keratosis: Secondary | ICD-10-CM | POA: Diagnosis not present

## 2016-10-02 DIAGNOSIS — C44329 Squamous cell carcinoma of skin of other parts of face: Secondary | ICD-10-CM | POA: Diagnosis not present

## 2016-10-09 DIAGNOSIS — R1032 Left lower quadrant pain: Secondary | ICD-10-CM | POA: Diagnosis not present

## 2016-10-09 DIAGNOSIS — R1084 Generalized abdominal pain: Secondary | ICD-10-CM | POA: Diagnosis not present

## 2016-11-07 DIAGNOSIS — Z23 Encounter for immunization: Secondary | ICD-10-CM | POA: Diagnosis not present

## 2016-12-03 DIAGNOSIS — H35372 Puckering of macula, left eye: Secondary | ICD-10-CM | POA: Diagnosis not present

## 2016-12-03 DIAGNOSIS — H25043 Posterior subcapsular polar age-related cataract, bilateral: Secondary | ICD-10-CM | POA: Diagnosis not present

## 2016-12-03 DIAGNOSIS — H25013 Cortical age-related cataract, bilateral: Secondary | ICD-10-CM | POA: Diagnosis not present

## 2016-12-03 DIAGNOSIS — H2513 Age-related nuclear cataract, bilateral: Secondary | ICD-10-CM | POA: Diagnosis not present

## 2016-12-03 DIAGNOSIS — H2512 Age-related nuclear cataract, left eye: Secondary | ICD-10-CM | POA: Diagnosis not present

## 2016-12-03 DIAGNOSIS — H18413 Arcus senilis, bilateral: Secondary | ICD-10-CM | POA: Diagnosis not present

## 2016-12-09 DIAGNOSIS — I889 Nonspecific lymphadenitis, unspecified: Secondary | ICD-10-CM | POA: Diagnosis not present

## 2016-12-09 DIAGNOSIS — L739 Follicular disorder, unspecified: Secondary | ICD-10-CM | POA: Diagnosis not present

## 2016-12-23 DIAGNOSIS — I889 Nonspecific lymphadenitis, unspecified: Secondary | ICD-10-CM | POA: Diagnosis not present

## 2016-12-23 DIAGNOSIS — L739 Follicular disorder, unspecified: Secondary | ICD-10-CM | POA: Diagnosis not present

## 2016-12-24 DIAGNOSIS — C441992 Other specified malignant neoplasm of skin of left lower eyelid, including canthus: Secondary | ICD-10-CM | POA: Diagnosis not present

## 2017-03-31 DIAGNOSIS — H2512 Age-related nuclear cataract, left eye: Secondary | ICD-10-CM | POA: Diagnosis not present

## 2017-04-01 DIAGNOSIS — H2511 Age-related nuclear cataract, right eye: Secondary | ICD-10-CM | POA: Diagnosis not present

## 2017-04-21 DIAGNOSIS — H2511 Age-related nuclear cataract, right eye: Secondary | ICD-10-CM | POA: Diagnosis not present

## 2017-04-28 DIAGNOSIS — Z0000 Encounter for general adult medical examination without abnormal findings: Secondary | ICD-10-CM | POA: Diagnosis not present

## 2017-04-28 DIAGNOSIS — I10 Essential (primary) hypertension: Secondary | ICD-10-CM | POA: Diagnosis not present

## 2017-04-28 DIAGNOSIS — N529 Male erectile dysfunction, unspecified: Secondary | ICD-10-CM | POA: Diagnosis not present

## 2017-04-28 DIAGNOSIS — E7800 Pure hypercholesterolemia, unspecified: Secondary | ICD-10-CM | POA: Diagnosis not present

## 2017-05-13 DIAGNOSIS — D0462 Carcinoma in situ of skin of left upper limb, including shoulder: Secondary | ICD-10-CM | POA: Diagnosis not present

## 2017-05-13 DIAGNOSIS — D0471 Carcinoma in situ of skin of right lower limb, including hip: Secondary | ICD-10-CM | POA: Diagnosis not present

## 2017-05-13 DIAGNOSIS — D0461 Carcinoma in situ of skin of right upper limb, including shoulder: Secondary | ICD-10-CM | POA: Diagnosis not present

## 2017-05-13 DIAGNOSIS — L570 Actinic keratosis: Secondary | ICD-10-CM | POA: Diagnosis not present

## 2017-05-22 DIAGNOSIS — S01112A Laceration without foreign body of left eyelid and periocular area, initial encounter: Secondary | ICD-10-CM | POA: Diagnosis not present

## 2017-05-22 DIAGNOSIS — S0033XA Contusion of nose, initial encounter: Secondary | ICD-10-CM | POA: Diagnosis not present

## 2017-06-05 DIAGNOSIS — L570 Actinic keratosis: Secondary | ICD-10-CM | POA: Diagnosis not present

## 2017-06-05 DIAGNOSIS — D0471 Carcinoma in situ of skin of right lower limb, including hip: Secondary | ICD-10-CM | POA: Diagnosis not present

## 2017-06-05 DIAGNOSIS — D485 Neoplasm of uncertain behavior of skin: Secondary | ICD-10-CM | POA: Diagnosis not present

## 2017-08-18 DIAGNOSIS — N500 Atrophy of testis: Secondary | ICD-10-CM | POA: Diagnosis not present

## 2017-08-18 DIAGNOSIS — R351 Nocturia: Secondary | ICD-10-CM | POA: Diagnosis not present

## 2017-08-18 DIAGNOSIS — N401 Enlarged prostate with lower urinary tract symptoms: Secondary | ICD-10-CM | POA: Diagnosis not present

## 2017-08-18 DIAGNOSIS — N402 Nodular prostate without lower urinary tract symptoms: Secondary | ICD-10-CM | POA: Diagnosis not present

## 2017-08-18 DIAGNOSIS — R3121 Asymptomatic microscopic hematuria: Secondary | ICD-10-CM | POA: Diagnosis not present

## 2017-09-08 DIAGNOSIS — E7800 Pure hypercholesterolemia, unspecified: Secondary | ICD-10-CM | POA: Diagnosis not present

## 2017-09-08 DIAGNOSIS — I10 Essential (primary) hypertension: Secondary | ICD-10-CM | POA: Diagnosis not present

## 2017-09-08 DIAGNOSIS — R51 Headache: Secondary | ICD-10-CM | POA: Diagnosis not present

## 2017-09-08 DIAGNOSIS — R42 Dizziness and giddiness: Secondary | ICD-10-CM | POA: Diagnosis not present

## 2017-09-12 ENCOUNTER — Other Ambulatory Visit: Payer: Self-pay | Admitting: Family Medicine

## 2017-09-12 DIAGNOSIS — R51 Headache: Principal | ICD-10-CM

## 2017-09-12 DIAGNOSIS — R519 Headache, unspecified: Secondary | ICD-10-CM

## 2017-09-15 ENCOUNTER — Ambulatory Visit
Admission: RE | Admit: 2017-09-15 | Discharge: 2017-09-15 | Disposition: A | Discharge status: Home / Self Care | Payer: Medicare Other | Source: Ambulatory Visit | Attending: Family Medicine | Admitting: Family Medicine

## 2017-09-15 ENCOUNTER — Other Ambulatory Visit: Payer: Self-pay | Admitting: Family Medicine

## 2017-09-15 DIAGNOSIS — R51 Headache: Principal | ICD-10-CM

## 2017-09-15 DIAGNOSIS — R519 Headache, unspecified: Secondary | ICD-10-CM

## 2017-09-15 MED ORDER — GADOBENATE DIMEGLUMINE 529 MG/ML IV SOLN
10.0000 mL | Freq: Once | INTRAVENOUS | Status: AC | PRN
  Administered 2017-09-15: 10 mL via INTRAVENOUS

## 2017-10-06 DIAGNOSIS — L570 Actinic keratosis: Secondary | ICD-10-CM | POA: Diagnosis not present

## 2017-10-06 DIAGNOSIS — D0471 Carcinoma in situ of skin of right lower limb, including hip: Secondary | ICD-10-CM | POA: Diagnosis not present

## 2017-11-19 DIAGNOSIS — Z23 Encounter for immunization: Secondary | ICD-10-CM | POA: Diagnosis not present

## 2017-12-09 DIAGNOSIS — M67911 Unspecified disorder of synovium and tendon, right shoulder: Secondary | ICD-10-CM | POA: Diagnosis not present

## 2017-12-10 DIAGNOSIS — M62838 Other muscle spasm: Secondary | ICD-10-CM | POA: Diagnosis not present

## 2017-12-10 DIAGNOSIS — S46811D Strain of other muscles, fascia and tendons at shoulder and upper arm level, right arm, subsequent encounter: Secondary | ICD-10-CM | POA: Diagnosis not present

## 2017-12-12 DIAGNOSIS — M62838 Other muscle spasm: Secondary | ICD-10-CM | POA: Diagnosis not present

## 2017-12-12 DIAGNOSIS — S46811D Strain of other muscles, fascia and tendons at shoulder and upper arm level, right arm, subsequent encounter: Secondary | ICD-10-CM | POA: Diagnosis not present

## 2017-12-16 DIAGNOSIS — M62838 Other muscle spasm: Secondary | ICD-10-CM | POA: Diagnosis not present

## 2017-12-16 DIAGNOSIS — S46811D Strain of other muscles, fascia and tendons at shoulder and upper arm level, right arm, subsequent encounter: Secondary | ICD-10-CM | POA: Diagnosis not present

## 2017-12-19 DIAGNOSIS — M62838 Other muscle spasm: Secondary | ICD-10-CM | POA: Diagnosis not present

## 2017-12-19 DIAGNOSIS — S46811D Strain of other muscles, fascia and tendons at shoulder and upper arm level, right arm, subsequent encounter: Secondary | ICD-10-CM | POA: Diagnosis not present

## 2017-12-22 DIAGNOSIS — S46811D Strain of other muscles, fascia and tendons at shoulder and upper arm level, right arm, subsequent encounter: Secondary | ICD-10-CM | POA: Diagnosis not present

## 2017-12-22 DIAGNOSIS — M62838 Other muscle spasm: Secondary | ICD-10-CM | POA: Diagnosis not present

## 2017-12-24 DIAGNOSIS — L570 Actinic keratosis: Secondary | ICD-10-CM | POA: Diagnosis not present

## 2017-12-24 DIAGNOSIS — L089 Local infection of the skin and subcutaneous tissue, unspecified: Secondary | ICD-10-CM | POA: Diagnosis not present

## 2017-12-24 DIAGNOSIS — M62838 Other muscle spasm: Secondary | ICD-10-CM | POA: Diagnosis not present

## 2017-12-24 DIAGNOSIS — D0439 Carcinoma in situ of skin of other parts of face: Secondary | ICD-10-CM | POA: Diagnosis not present

## 2017-12-24 DIAGNOSIS — S46811D Strain of other muscles, fascia and tendons at shoulder and upper arm level, right arm, subsequent encounter: Secondary | ICD-10-CM | POA: Diagnosis not present

## 2017-12-29 DIAGNOSIS — M62838 Other muscle spasm: Secondary | ICD-10-CM | POA: Diagnosis not present

## 2017-12-29 DIAGNOSIS — S46811D Strain of other muscles, fascia and tendons at shoulder and upper arm level, right arm, subsequent encounter: Secondary | ICD-10-CM | POA: Diagnosis not present

## 2018-01-05 DIAGNOSIS — S46811D Strain of other muscles, fascia and tendons at shoulder and upper arm level, right arm, subsequent encounter: Secondary | ICD-10-CM | POA: Diagnosis not present

## 2018-01-05 DIAGNOSIS — M62838 Other muscle spasm: Secondary | ICD-10-CM | POA: Diagnosis not present

## 2018-01-07 DIAGNOSIS — M62838 Other muscle spasm: Secondary | ICD-10-CM | POA: Diagnosis not present

## 2018-01-07 DIAGNOSIS — S46811D Strain of other muscles, fascia and tendons at shoulder and upper arm level, right arm, subsequent encounter: Secondary | ICD-10-CM | POA: Diagnosis not present

## 2018-01-08 DIAGNOSIS — M25511 Pain in right shoulder: Secondary | ICD-10-CM | POA: Diagnosis not present

## 2018-01-12 DIAGNOSIS — M62838 Other muscle spasm: Secondary | ICD-10-CM | POA: Diagnosis not present

## 2018-01-12 DIAGNOSIS — S46811D Strain of other muscles, fascia and tendons at shoulder and upper arm level, right arm, subsequent encounter: Secondary | ICD-10-CM | POA: Diagnosis not present

## 2018-01-15 DIAGNOSIS — M62838 Other muscle spasm: Secondary | ICD-10-CM | POA: Diagnosis not present

## 2018-01-15 DIAGNOSIS — S46811D Strain of other muscles, fascia and tendons at shoulder and upper arm level, right arm, subsequent encounter: Secondary | ICD-10-CM | POA: Diagnosis not present

## 2018-01-19 DIAGNOSIS — S46811D Strain of other muscles, fascia and tendons at shoulder and upper arm level, right arm, subsequent encounter: Secondary | ICD-10-CM | POA: Diagnosis not present

## 2018-01-19 DIAGNOSIS — M62838 Other muscle spasm: Secondary | ICD-10-CM | POA: Diagnosis not present

## 2018-02-09 DIAGNOSIS — L570 Actinic keratosis: Secondary | ICD-10-CM | POA: Diagnosis not present

## 2018-02-09 DIAGNOSIS — D0439 Carcinoma in situ of skin of other parts of face: Secondary | ICD-10-CM | POA: Diagnosis not present

## 2018-02-09 DIAGNOSIS — D0462 Carcinoma in situ of skin of left upper limb, including shoulder: Secondary | ICD-10-CM | POA: Diagnosis not present

## 2018-02-09 DIAGNOSIS — D0461 Carcinoma in situ of skin of right upper limb, including shoulder: Secondary | ICD-10-CM | POA: Diagnosis not present

## 2018-04-02 DIAGNOSIS — H2513 Age-related nuclear cataract, bilateral: Secondary | ICD-10-CM | POA: Diagnosis not present

## 2018-04-28 DIAGNOSIS — D0462 Carcinoma in situ of skin of left upper limb, including shoulder: Secondary | ICD-10-CM | POA: Diagnosis not present

## 2018-04-28 DIAGNOSIS — D0461 Carcinoma in situ of skin of right upper limb, including shoulder: Secondary | ICD-10-CM | POA: Diagnosis not present

## 2018-04-28 DIAGNOSIS — D0439 Carcinoma in situ of skin of other parts of face: Secondary | ICD-10-CM | POA: Diagnosis not present

## 2018-05-11 DIAGNOSIS — Z125 Encounter for screening for malignant neoplasm of prostate: Secondary | ICD-10-CM | POA: Diagnosis not present

## 2018-05-11 DIAGNOSIS — I10 Essential (primary) hypertension: Secondary | ICD-10-CM | POA: Diagnosis not present

## 2018-05-11 DIAGNOSIS — E7800 Pure hypercholesterolemia, unspecified: Secondary | ICD-10-CM | POA: Diagnosis not present

## 2018-06-17 DIAGNOSIS — H35372 Puckering of macula, left eye: Secondary | ICD-10-CM | POA: Diagnosis not present

## 2018-06-17 DIAGNOSIS — H43813 Vitreous degeneration, bilateral: Secondary | ICD-10-CM | POA: Diagnosis not present

## 2018-07-01 DIAGNOSIS — C4442 Squamous cell carcinoma of skin of scalp and neck: Secondary | ICD-10-CM | POA: Diagnosis not present

## 2018-07-01 DIAGNOSIS — C44529 Squamous cell carcinoma of skin of other part of trunk: Secondary | ICD-10-CM | POA: Diagnosis not present

## 2018-07-01 DIAGNOSIS — D0471 Carcinoma in situ of skin of right lower limb, including hip: Secondary | ICD-10-CM | POA: Diagnosis not present

## 2018-07-01 DIAGNOSIS — L570 Actinic keratosis: Secondary | ICD-10-CM | POA: Diagnosis not present

## 2018-07-01 DIAGNOSIS — C44629 Squamous cell carcinoma of skin of left upper limb, including shoulder: Secondary | ICD-10-CM | POA: Diagnosis not present

## 2018-07-30 DIAGNOSIS — L570 Actinic keratosis: Secondary | ICD-10-CM | POA: Diagnosis not present

## 2018-07-30 DIAGNOSIS — D0471 Carcinoma in situ of skin of right lower limb, including hip: Secondary | ICD-10-CM | POA: Diagnosis not present

## 2018-08-12 DIAGNOSIS — N401 Enlarged prostate with lower urinary tract symptoms: Secondary | ICD-10-CM | POA: Diagnosis not present

## 2018-08-19 DIAGNOSIS — N402 Nodular prostate without lower urinary tract symptoms: Secondary | ICD-10-CM | POA: Diagnosis not present

## 2018-08-19 DIAGNOSIS — N500 Atrophy of testis: Secondary | ICD-10-CM | POA: Diagnosis not present

## 2018-08-24 DIAGNOSIS — E7800 Pure hypercholesterolemia, unspecified: Secondary | ICD-10-CM | POA: Diagnosis not present

## 2018-08-24 DIAGNOSIS — I10 Essential (primary) hypertension: Secondary | ICD-10-CM | POA: Diagnosis not present

## 2018-08-24 DIAGNOSIS — Z125 Encounter for screening for malignant neoplasm of prostate: Secondary | ICD-10-CM | POA: Diagnosis not present

## 2018-09-17 DIAGNOSIS — M6289 Other specified disorders of muscle: Secondary | ICD-10-CM | POA: Diagnosis not present

## 2018-09-17 DIAGNOSIS — M542 Cervicalgia: Secondary | ICD-10-CM | POA: Diagnosis not present

## 2018-09-17 DIAGNOSIS — M25512 Pain in left shoulder: Secondary | ICD-10-CM | POA: Diagnosis not present

## 2018-09-17 DIAGNOSIS — M4692 Unspecified inflammatory spondylopathy, cervical region: Secondary | ICD-10-CM | POA: Diagnosis not present

## 2018-09-17 DIAGNOSIS — M62838 Other muscle spasm: Secondary | ICD-10-CM | POA: Diagnosis not present

## 2018-09-21 DIAGNOSIS — M62838 Other muscle spasm: Secondary | ICD-10-CM | POA: Diagnosis not present

## 2018-09-21 DIAGNOSIS — M6289 Other specified disorders of muscle: Secondary | ICD-10-CM | POA: Diagnosis not present

## 2018-09-22 ENCOUNTER — Other Ambulatory Visit: Payer: Self-pay

## 2018-09-22 ENCOUNTER — Encounter: Payer: Self-pay | Admitting: Family Medicine

## 2018-09-22 ENCOUNTER — Ambulatory Visit (INDEPENDENT_AMBULATORY_CARE_PROVIDER_SITE_OTHER): Payer: Medicare Other | Admitting: Family Medicine

## 2018-09-22 ENCOUNTER — Ambulatory Visit: Payer: Self-pay

## 2018-09-22 VITALS — BP 128/78 | Ht 66.0 in | Wt 124.0 lb

## 2018-09-22 DIAGNOSIS — G8929 Other chronic pain: Secondary | ICD-10-CM

## 2018-09-22 DIAGNOSIS — M5412 Radiculopathy, cervical region: Secondary | ICD-10-CM | POA: Diagnosis not present

## 2018-09-22 DIAGNOSIS — M958 Other specified acquired deformities of musculoskeletal system: Secondary | ICD-10-CM

## 2018-09-22 MED ORDER — GABAPENTIN 300 MG PO CAPS: 300.0000 mg | ORAL_CAPSULE | Freq: Three times a day (TID) | ORAL | 1 refills | Status: DC

## 2018-09-22 NOTE — Progress Notes (Signed)
Jaime Waters - 78 y.o. male MRN [id number]  Date of birth: September 06, 1940  SUBJECTIVE:  Including CC & ROS.  Chief Complaint  Patient presents with  . Shoulder Pain    left shoulder x 2 weeks    Jaime Waters is a 78 y.o. male that is presenting with left shoulder pain.  The symptoms have been acute on chronic in nature.  He has been through physical therapy with limited improvement with dry needling of the trapezius and lateral shoulder.  He is also received trigger point injections in the left trapezius.  He feels like his symptoms have been worsened as of late.  He is an avid Air cabin crew.  Denies any inciting event or trauma.  The pain is moderate to severe.  Was given tizanidine with no improvement.  Has some pain that radiates from the shoulder down to the elbow.  Also has pain in the left trapezius and lateral side of the left neck.  Pain seems to be worse at night.  He is been seen by orthopedist that showed no significant changes in the cervical spine x-rays and left shoulder x-rays.  His right shoulder has taken most of the brunt of previous injuries.  He has had 2 dislocations as well as 2 fractures of the clavicle midshaft.   Review of Systems  Constitutional: Negative for fever.  HENT: Negative for congestion.   Respiratory: Negative for cough.   Cardiovascular: Negative for chest pain.  Gastrointestinal: Negative for abdominal pain.  Musculoskeletal: Positive for back pain.  Skin: Negative for color change.  Neurological: Negative for weakness.  Hematological: Negative for adenopathy.    HISTORY: Past Medical, Surgical, Social, and Family History Reviewed & Updated per EMR.   Pertinent Historical Findings include:  Past Medical History:  Diagnosis Date  . Benign essential HTN   . Diverticulosis   . Hyperlipidemia   . Kidney stone   . Melanoma ([place])   . Testicular atrophy     Past Surgical History:  Procedure Laterality Date  . APPENDECTOMY       No Known Allergies  Family History  Problem Relation Age of Onset  . Dementia Mother   . Pancreatic cancer Father   . Hypertension Father   . Heart attack Neg Hx      Social History   Socioeconomic History  . Marital status: Married    Spouse name: Not on file  . Number of children: Not on file  . Years of education: Not on file  . Highest education level: Not on file  Occupational History  . Not on file  Social Needs  . Financial resource strain: Not on file  . Food insecurity    Worry: Not on file    Inability: Not on file  . Transportation needs    Medical: Not on file    Non-medical: Not on file  Tobacco Use  . Smoking status: Never Smoker  . Smokeless tobacco: Never Used  Substance and Sexual Activity  . Alcohol use: Yes    Alcohol/week: 3.0 standard drinks    Types: 3 Glasses of wine per week  . Drug use: Not on file  . Sexual activity: Not on file  Lifestyle  . Physical activity    Days per week: Not on file    Minutes per session: Not on file  . Stress: Not on file  Relationships  . Social Herbalist on phone: Not on file    Gets  together: Not on file    Attends religious service: Not on file    Active member of club or organization: Not on file    Attends meetings of clubs or organizations: Not on file    Relationship status: Not on file  . Intimate partner violence    Fear of current or ex partner: Not on file    Emotionally abused: Not on file    Physically abused: Not on file    Forced sexual activity: Not on file  Other Topics Concern  . Not on file  Social History Narrative  . Not on file     PHYSICAL EXAM:  VS: BP 128/78   Ht 5\' 6"  (1.676 m)   Wt 124 lb (56.2 kg)   BMI 20.01 kg/m  Physical Exam Gen: NAD, alert, cooperative with exam, well-appearing ENT: normal lips, normal nasal mucosa,  Eye: normal EOM, normal conjunctiva and lids CV:  no edema, +2 pedal pulses   Resp: no accessory muscle use, non-labored,   Skin:  no rashes, no areas of induration  Neuro: normal tone, normal sensation to touch Psych:  normal insight, alert and oriented MSK:  Neck: No tenderness palpation of the cervical spine. Normal range of motion. Has nonradiating pain localized to the neck with Spurling's test. Left shoulder: No tenderness to palpation over the lateral shoulder. Has normal internal and external rotation. Normal strength resistance. Mild pain with empty can testing. Normal Hawkins test. Winging of the scapula was apparent. Neurovascular intact  Limited ultrasound: Left shoulder:  Normal-appearing biceps tendon. Normal-appearing subscapularis. Supraspinatus no significant tendinopathy type changes.  There does appear to be a mild impingement on dynamic testing. No effusion in the posterior glenohumeral joint.  Summary: Mild impingement  Ultrasound and interpretation by Clearance Coots, MD     ASSESSMENT & PLAN:   Winging of scapula He reports his wife has noticed this for some time.  Likely the source of most of his problem stems from this imbalance.  Seems chronic at this point.  - gabapentin.  - counseled on HEP and supportive care - if no improvement would consider subacromial injection. May consider formal PT for winging. May consider EMG to evaluate scapular nerves. May consider MRI cervical spine if more radicular symptoms.

## 2018-09-22 NOTE — Patient Instructions (Signed)
Nice to meet you Please try the gabapentin. Please start with one pill at night and then increase to 2 or 3 times daily  Please try the exercises   Please send me a message in MyChart with any questions or updates.  Please see me back in 4 weeks.   --Dr. Raeford Razor

## 2018-09-22 NOTE — Assessment & Plan Note (Signed)
He reports his wife has noticed this for some time.  Likely the source of most of his problem stems from this imbalance.  Seems chronic at this point.  - gabapentin.  - counseled on HEP and supportive care - if no improvement would consider subacromial injection. May consider formal PT for winging. May consider EMG to evaluate scapular nerves. May consider MRI cervical spine if more radicular symptoms.

## 2018-09-25 ENCOUNTER — Encounter: Payer: Self-pay | Admitting: Family Medicine

## 2018-09-25 ENCOUNTER — Ambulatory Visit (INDEPENDENT_AMBULATORY_CARE_PROVIDER_SITE_OTHER): Payer: Medicare Other | Admitting: Family Medicine

## 2018-09-25 ENCOUNTER — Ambulatory Visit: Payer: Self-pay

## 2018-09-25 ENCOUNTER — Other Ambulatory Visit: Payer: Self-pay

## 2018-09-25 VITALS — Ht 66.0 in | Wt 124.0 lb

## 2018-09-25 DIAGNOSIS — M7542 Impingement syndrome of left shoulder: Secondary | ICD-10-CM | POA: Diagnosis not present

## 2018-09-25 MED ORDER — METHYLPREDNISOLONE ACETATE 40 MG/ML IJ SUSP
40.0000 mg | Freq: Once | INTRAMUSCULAR | Status: AC
  Administered 2018-09-25: 11:00:00 40 mg via INTRA_ARTICULAR

## 2018-09-25 NOTE — Patient Instructions (Signed)
Good to see you Please continue the exercises  Please try ice on the shoulder  Please try heat on the back   Please send me a message in MyChart with any questions or updates.  Please see me back in 4-6 weeks.   --Dr. Raeford Razor

## 2018-09-25 NOTE — Progress Notes (Signed)
Jaime Waters - 78 y.o. male MRN [id number]  Date of birth: [birth year]  SUBJECTIVE:  Including CC & ROS.  Chief Complaint  Patient presents with  . Shoulder Pain    left shoulder    Jaime Waters is a 78 y.o. male that is presenting with acute left shoulder pain.  He was seen previously and had winging of the scapula.  He has been doing the home exercise program and feels that this exacerbated his left shoulder pain.  He has stopped doing the exercises that seem to make it worse.  The pain is severe.  It is occurring over the lateral left shoulder with some radiation down to the elbow.  Is intermittent in nature.  It is sharp and stabbing.  Denies any injury.   Review of Systems  Constitutional: Negative for fever.  HENT: Negative for congestion.   Respiratory: Negative for cough.   Cardiovascular: Negative for chest pain.  Gastrointestinal: Negative for abdominal pain.  Musculoskeletal: Positive for back pain.  Skin: Negative for color change.  Neurological: Negative for weakness.  Hematological: Negative for adenopathy.    HISTORY: Past Medical, Surgical, Social, and Family History Reviewed & Updated per EMR.   Pertinent Historical Findings include:  Past Medical History:  Diagnosis Date  . Benign essential HTN   . Diverticulosis   . Hyperlipidemia   . Kidney stone   . Melanoma ([place])   . Testicular atrophy     Past Surgical History:  Procedure Laterality Date  . APPENDECTOMY      No Known Allergies  Family History  Problem Relation Age of Onset  . Dementia Mother   . Pancreatic cancer Father   . Hypertension Father   . Heart attack Neg Hx      Social History   Socioeconomic History  . Marital status: Married    Spouse name: Not on file  . Number of children: Not on file  . Years of education: Not on file  . Highest education level: Not on file  Occupational History  . Not on file  Social Needs  . Financial resource strain: Not on  file  . Food insecurity    Worry: Not on file    Inability: Not on file  . Transportation needs    Medical: Not on file    Non-medical: Not on file  Tobacco Use  . Smoking status: Never Smoker  . Smokeless tobacco: Never Used  Substance and Sexual Activity  . Alcohol use: Yes    Alcohol/week: 3.0 standard drinks    Types: 3 Glasses of wine per week  . Drug use: Not on file  . Sexual activity: Not on file  Lifestyle  . Physical activity    Days per week: Not on file    Minutes per session: Not on file  . Stress: Not on file  Relationships  . Social Herbalist on phone: Not on file    Gets together: Not on file    Attends religious service: Not on file    Active member of club or organization: Not on file    Attends meetings of clubs or organizations: Not on file    Relationship status: Not on file  . Intimate partner violence    Fear of current or ex partner: Not on file    Emotionally abused: Not on file    Physically abused: Not on file    Forced sexual activity: Not on file  Other  Topics Concern  . Not on file  Social History Narrative  . Not on file     PHYSICAL EXAM:  VS: Ht 5\' 6"  (1.676 m)   Wt 124 lb (56.2 kg)   BMI 20.01 kg/m  Physical Exam Gen: NAD, alert, cooperative with exam, well-appearing ENT: normal lips, normal nasal mucosa,  Eye: normal EOM, normal conjunctiva and lids CV:  no edema, +2 pedal pulses   Resp: no accessory muscle use, non-labored,  GI: no masses or tenderness, no hernia  Skin: no rashes, no areas of induration  Neuro: normal tone, normal sensation to touch Psych:  normal insight, alert and oriented MSK:  Left shoulder: Normal active flexion abduction. Normal internal and external rotation. Some pain with empty can testing and Hawkins testing. Neurovascular intact   Aspiration/Injection Procedure Note JALONTAE BOLAN Waters Jul 20, 1940  Procedure: Injection Indications: Left shoulder pain  Procedure Details  Consent: Risks of procedure as well as the alternatives and risks of each were explained to the (patient/caregiver).  Consent for procedure obtained. Time Out: Verified patient identification, verified procedure, site/side was marked, verified correct patient position, special equipment/implants available, medications/allergies/relevent history reviewed, required imaging and test results available.  Performed.  The area was cleaned with iodine and alcohol swabs.    The left subacromial space was injected using 1 cc's of 40 mg Depo-Medrol and 4 cc's of 0.25% bupivacaine with a 22 1 1/2" needle.  Ultrasound was used. Images were obtained in long views showing the injection.     A sterile dressing was applied.  Patient did tolerate procedure well.       ASSESSMENT & PLAN:   Impingement syndrome of left shoulder Performing the exercises for the winging of the scapula seems to exacerbate the left shoulder. -Injection today. -Counseled on home exercises and how to graduate to different levels based on discomfort. -Counseled on supportive care. -Could consider physical therapy.

## 2018-09-28 ENCOUNTER — Encounter: Payer: Self-pay | Admitting: Family Medicine

## 2018-09-28 ENCOUNTER — Other Ambulatory Visit: Payer: Self-pay | Admitting: Family Medicine

## 2018-09-28 DIAGNOSIS — M5412 Radiculopathy, cervical region: Secondary | ICD-10-CM

## 2018-09-28 NOTE — Progress Notes (Signed)
Ordered MRI to evaluate for cervical radiculopathy. He received a subacromial injection and received no relief.   Rosemarie Ax, MD [facility] 09/28/2018, 9:45 AM

## 2018-09-28 NOTE — Assessment & Plan Note (Signed)
Performing the exercises for the winging of the scapula seems to exacerbate the left shoulder. -Injection today. -Counseled on home exercises and how to graduate to different levels based on discomfort. -Counseled on supportive care. -Could consider physical therapy.

## 2018-09-29 ENCOUNTER — Encounter: Payer: Self-pay | Admitting: Family Medicine

## 2018-09-29 NOTE — Addendum Note (Signed)
Addended by: Sherrie George F on: 09/29/2018 02:31 PM   Modules accepted: Orders

## 2018-10-07 ENCOUNTER — Ambulatory Visit (INDEPENDENT_AMBULATORY_CARE_PROVIDER_SITE_OTHER): Payer: Medicare Other

## 2018-10-07 ENCOUNTER — Other Ambulatory Visit: Payer: Self-pay

## 2018-10-07 ENCOUNTER — Encounter: Payer: Self-pay | Admitting: Family Medicine

## 2018-10-07 ENCOUNTER — Ambulatory Visit (INDEPENDENT_AMBULATORY_CARE_PROVIDER_SITE_OTHER): Payer: Medicare Other | Admitting: Family Medicine

## 2018-10-07 DIAGNOSIS — M5412 Radiculopathy, cervical region: Secondary | ICD-10-CM | POA: Diagnosis not present

## 2018-10-07 DIAGNOSIS — M4802 Spinal stenosis, cervical region: Secondary | ICD-10-CM | POA: Diagnosis not present

## 2018-10-07 NOTE — Progress Notes (Signed)
Jaime Waters - 78 y.o. male MRN [id number]  Date of birth: [birth year]  SUBJECTIVE:  Including CC & ROS.  No chief complaint on file.   Jaime Waters is a 78 y.o. male that is following up for his MRI of the cervical spine.  He has been taking gabapentin and notices mild improvement.  Independent review of the MRI cervical spine from 9/9 shows left foraminal stenosis on C3-4 and C6-7.   Review of Systems  Constitutional: Negative for fever.  HENT: Negative for congestion.   Respiratory: Negative for cough.   Cardiovascular: Negative for chest pain.  Gastrointestinal: Negative for abdominal distention.  Musculoskeletal: Positive for back pain.  Neurological: Negative for weakness.  Hematological: Negative for adenopathy.    HISTORY: Past Medical, Surgical, Social, and Family History Reviewed & Updated per EMR.   Pertinent Historical Findings include:  Past Medical History:  Diagnosis Date  . Benign essential HTN   . Diverticulosis   . Hyperlipidemia   . Kidney stone   . Melanoma ([place])   . Testicular atrophy     Past Surgical History:  Procedure Laterality Date  . APPENDECTOMY      No Known Allergies  Family History  Problem Relation Age of Onset  . Dementia Mother   . Pancreatic cancer Father   . Hypertension Father   . Heart attack Neg Hx      Social History   Socioeconomic History  . Marital status: Married    Spouse name: Not on file  . Number of children: Not on file  . Years of education: Not on file  . Highest education level: Not on file  Occupational History  . Not on file  Social Needs  . Financial resource strain: Not on file  . Food insecurity    Worry: Not on file    Inability: Not on file  . Transportation needs    Medical: Not on file    Non-medical: Not on file  Tobacco Use  . Smoking status: Never Smoker  . Smokeless tobacco: Never Used  Substance and Sexual Activity  . Alcohol use: Yes    Alcohol/week: 3.0  standard drinks    Types: 3 Glasses of wine per week  . Drug use: Not on file  . Sexual activity: Not on file  Lifestyle  . Physical activity    Days per week: Not on file    Minutes per session: Not on file  . Stress: Not on file  Relationships  . Social Herbalist on phone: Not on file    Gets together: Not on file    Attends religious service: Not on file    Active member of club or organization: Not on file    Attends meetings of clubs or organizations: Not on file    Relationship status: Not on file  . Intimate partner violence    Fear of current or ex partner: Not on file    Emotionally abused: Not on file    Physically abused: Not on file    Forced sexual activity: Not on file  Other Topics Concern  . Not on file  Social History Narrative  . Not on file     PHYSICAL EXAM:  VS: There were no vitals taken for this visit. Physical Exam Gen: NAD, alert, cooperative with exam, well-appearing ENT: normal lips, normal nasal mucosa,  Eye: normal EOM, normal conjunctiva and lids CV:  no edema, +2 pedal pulses  Resp: no accessory muscle use, non-labored,  Skin: no rashes, no areas of induration  Neuro: normal tone, normal sensation to touch Psych:  normal insight, alert and oriented MSK:  Neck:  No midline TTp of the cervical spine  Normal strength to resistance with shrug  NVI       ASSESSMENT & PLAN:   Cervical radiculopathy The pain he experiences in the trapezius as well as the shoulder and tricep area appear to be more associated with cervical radiculopathy.  He has had an injection in the subacromial space and received no improvement on the left side.  MRI is showing some foraminal stenosis on that side. -Referral to Dr. Mina Marble at [facility] ortho for consideration of Epidural injections

## 2018-10-08 DIAGNOSIS — M5412 Radiculopathy, cervical region: Secondary | ICD-10-CM | POA: Insufficient documentation

## 2018-10-08 NOTE — Assessment & Plan Note (Signed)
The pain he experiences in the trapezius as well as the shoulder and tricep area appear to be more associated with cervical radiculopathy.  He has had an injection in the subacromial space and received no improvement on the left side.  MRI is showing some foraminal stenosis on that side. -Referral to Dr. Mina Marble at [facility] ortho for consideration of Epidural injections

## 2018-10-19 ENCOUNTER — Ambulatory Visit: Payer: Medicare Other | Admitting: Family Medicine

## 2018-10-19 NOTE — Progress Notes (Deleted)
  Jaime Waters - 78 y.o. male MRN [id number]  Date of birth: 1940-09-20  SUBJECTIVE:  Including CC & ROS.  No chief complaint on file.   Jaime Waters is a 78 y.o. male that is  ***.  ***   Review of Systems  HISTORY: Past Medical, Surgical, Social, and Family History Reviewed & Updated per EMR.   Pertinent Historical Findings include:  Past Medical History:  Diagnosis Date  . Benign essential HTN   . Diverticulosis   . Hyperlipidemia   . Kidney stone   . Melanoma ([place])   . Testicular atrophy     Past Surgical History:  Procedure Laterality Date  . APPENDECTOMY      No Known Allergies  Family History  Problem Relation Age of Onset  . Dementia Mother   . Pancreatic cancer Father   . Hypertension Father   . Heart attack Neg Hx      Social History   Socioeconomic History  . Marital status: Married    Spouse name: Not on file  . Number of children: Not on file  . Years of education: Not on file  . Highest education level: Not on file  Occupational History  . Not on file  Social Needs  . Financial resource strain: Not on file  . Food insecurity    Worry: Not on file    Inability: Not on file  . Transportation needs    Medical: Not on file    Non-medical: Not on file  Tobacco Use  . Smoking status: Never Smoker  . Smokeless tobacco: Never Used  Substance and Sexual Activity  . Alcohol use: Yes    Alcohol/week: 3.0 standard drinks    Types: 3 Glasses of wine per week  . Drug use: Not on file  . Sexual activity: Not on file  Lifestyle  . Physical activity    Days per week: Not on file    Minutes per session: Not on file  . Stress: Not on file  Relationships  . Social Herbalist on phone: Not on file    Gets together: Not on file    Attends religious service: Not on file    Active member of club or organization: Not on file    Attends meetings of clubs or organizations: Not on file    Relationship status: Not on  file  . Intimate partner violence    Fear of current or ex partner: Not on file    Emotionally abused: Not on file    Physically abused: Not on file    Forced sexual activity: Not on file  Other Topics Concern  . Not on file  Social History Narrative  . Not on file     PHYSICAL EXAM:  VS: There were no vitals taken for this visit. Physical Exam Gen: NAD, alert, cooperative with exam, well-appearing ENT: normal lips, normal nasal mucosa,  Eye: normal EOM, normal conjunctiva and lids CV:  no edema, +2 pedal pulses   Resp: no accessory muscle use, non-labored,  GI: no masses or tenderness, no hernia  Skin: no rashes, no areas of induration  Neuro: normal tone, normal sensation to touch Psych:  normal insight, alert and oriented MSK:  ***      ASSESSMENT & PLAN:   No problem-specific Assessment & Plan notes found for this encounter.

## 2018-10-23 DIAGNOSIS — M5412 Radiculopathy, cervical region: Secondary | ICD-10-CM | POA: Diagnosis not present

## 2018-11-20 DIAGNOSIS — R972 Elevated prostate specific antigen [PSA]: Secondary | ICD-10-CM | POA: Diagnosis not present

## 2018-11-26 DIAGNOSIS — L309 Dermatitis, unspecified: Secondary | ICD-10-CM | POA: Diagnosis not present

## 2018-11-26 DIAGNOSIS — L570 Actinic keratosis: Secondary | ICD-10-CM | POA: Diagnosis not present

## 2018-11-26 DIAGNOSIS — D485 Neoplasm of uncertain behavior of skin: Secondary | ICD-10-CM | POA: Diagnosis not present

## 2018-11-26 DIAGNOSIS — Z23 Encounter for immunization: Secondary | ICD-10-CM | POA: Diagnosis not present

## 2018-12-12 DIAGNOSIS — Z20828 Contact with and (suspected) exposure to other viral communicable diseases: Secondary | ICD-10-CM | POA: Diagnosis not present

## 2019-02-01 DIAGNOSIS — H8110 Benign paroxysmal vertigo, unspecified ear: Secondary | ICD-10-CM | POA: Diagnosis not present

## 2019-02-11 ENCOUNTER — Ambulatory Visit: Payer: Medicare Other | Attending: Internal Medicine

## 2019-02-11 DIAGNOSIS — Z23 Encounter for immunization: Secondary | ICD-10-CM | POA: Diagnosis not present

## 2019-02-11 NOTE — Progress Notes (Signed)
   Covid-19 Vaccination Clinic  Name:  Jaime Waters    MRN: [id number] DOB: 1940-05-31  02/11/2019  Jaime Waters was observed post Covid-19 immunization for 15 minutes without incidence. He was provided with Vaccine Information Sheet and instruction to access the V-Safe system.   Jaime Waters was instructed to call 911 with any severe reactions post vaccine: Marland Kitchen Difficulty breathing  . Swelling of your face and throat  . A fast heartbeat  . A bad rash all over your body  . Dizziness and weakness    Immunizations Administered    Name Date Dose VIS Date Route   Pfizer COVID-19 Vaccine 02/11/2019 10:46 AM 0.3 mL 01/08/2019 Intramuscular   Manufacturer: [employer], [employer]   Lot: [id number]   [place]: [id number]

## 2019-03-04 ENCOUNTER — Ambulatory Visit: Payer: Medicare Other | Attending: Internal Medicine

## 2019-03-04 DIAGNOSIS — Z23 Encounter for immunization: Secondary | ICD-10-CM

## 2019-03-04 NOTE — Progress Notes (Signed)
   Covid-19 Vaccination Clinic  Name:  Jaime Waters    MRN: [id number] DOB: [birth year]  03/04/2019  [name] was observed post Covid-19 immunization for 15 minutes without incidence. He was provided with Vaccine Information Sheet and instruction to access the V-Safe system.   [name] was instructed to call 911 with any severe reactions post vaccine: Marland Kitchen Difficulty breathing  . Swelling of your face and throat  . A fast heartbeat  . A bad rash all over your body  . Dizziness and weakness    Immunizations Administered    Name Date Dose VIS Date Route   Pfizer COVID-19 Vaccine 03/04/2019  9:21 AM 0.3 mL 01/08/2019 Intramuscular   Manufacturer: [place]   Lot: [id number]   [place]: [id number]

## 2019-03-31 ENCOUNTER — Encounter: Payer: Self-pay | Admitting: *Deleted

## 2019-04-19 ENCOUNTER — Encounter: Payer: Self-pay | Admitting: Physician Assistant

## 2019-04-19 ENCOUNTER — Other Ambulatory Visit: Payer: Self-pay

## 2019-04-19 ENCOUNTER — Ambulatory Visit (INDEPENDENT_AMBULATORY_CARE_PROVIDER_SITE_OTHER): Payer: Medicare Other | Admitting: Physician Assistant

## 2019-04-19 DIAGNOSIS — Z1283 Encounter for screening for malignant neoplasm of skin: Secondary | ICD-10-CM | POA: Diagnosis not present

## 2019-04-19 DIAGNOSIS — L570 Actinic keratosis: Secondary | ICD-10-CM | POA: Diagnosis not present

## 2019-04-19 DIAGNOSIS — C4360 Malignant melanoma of unspecified upper limb, including shoulder: Secondary | ICD-10-CM | POA: Diagnosis not present

## 2019-04-19 NOTE — Progress Notes (Signed)
Patient in room #2

## 2019-04-19 NOTE — Progress Notes (Addendum)
   Follow-Up Visit   Subjective  Jaime Waters is a 79 y.o. male who presents for the following: Follow-up (spot on left outer eyelid is back was biopsied on 02/02/2019).   The following portions of the chart were reviewed this encounter and updated as appropriate: Tobacco  Allergies  Meds  Problems  Med Hx  Surg Hx  Fam Hx      Objective  Well appearing patient in no apparent distress; mood and affect are within normal limits.  All skin waist up examined.  Objective  Left Anterior Neck, Left Antihelix, Left Dorsal Hand (4), Left Elbow - Posterior, Left Forearm - Posterior, Left Posterior Neck, Left Superior Crus of Antihelix, Left Temple, Right Dorsal Hand (2), Right Forearm - Posterior, Right Scaphoid Fossa: Erythematous patches with gritty scale.  Objective  waist up: Waist up exam.  No DN/ Signs of NMSC  Assessment & Plan  AK (actinic keratosis) (15) Left Elbow - Posterior; Left Forearm - Posterior; Right Forearm - Posterior; Left Dorsal Hand (4); Right Dorsal Hand (2); Right Scaphoid Fossa; Left Antihelix; Left Superior Crus of Antihelix; Left Temple; Left Anterior Neck; Left Posterior Neck  Destruction of lesion - Left Anterior Neck, Left Antihelix, Left Dorsal Hand (4), Left Elbow - Posterior, Left Forearm - Posterior, Left Posterior Neck, Left Superior Crus of Antihelix, Left Temple, Right Dorsal Hand (2), Right Scaphoid Fossa Complexity: simple   Destruction method: cryotherapy   Informed consent: discussed and consent obtained   Timeout:  patient name, date of birth, surgical site, and procedure verified Lesion destroyed using liquid nitrogen: Yes   Cryotherapy cycles:  1 Outcome: patient tolerated procedure well with no complications   Post-procedure details: wound care instructions given    Malignant melanoma of upper extremity, including shoulder, unspecified laterality ([place])  Screening exam for skin cancer waist up  observe No atypical nevi  noted at the time of the visit.

## 2019-04-22 DIAGNOSIS — H18413 Arcus senilis, bilateral: Secondary | ICD-10-CM | POA: Diagnosis not present

## 2019-04-27 DIAGNOSIS — H57813 Brow ptosis, bilateral: Secondary | ICD-10-CM | POA: Diagnosis not present

## 2019-04-27 DIAGNOSIS — H02831 Dermatochalasis of right upper eyelid: Secondary | ICD-10-CM | POA: Diagnosis not present

## 2019-04-27 DIAGNOSIS — H019 Unspecified inflammation of eyelid: Secondary | ICD-10-CM | POA: Diagnosis not present

## 2019-04-27 DIAGNOSIS — Z85828 Personal history of other malignant neoplasm of skin: Secondary | ICD-10-CM | POA: Diagnosis not present

## 2019-04-27 DIAGNOSIS — D485 Neoplasm of uncertain behavior of skin: Secondary | ICD-10-CM | POA: Diagnosis not present

## 2019-04-27 DIAGNOSIS — H02834 Dermatochalasis of left upper eyelid: Secondary | ICD-10-CM | POA: Diagnosis not present

## 2019-05-07 DIAGNOSIS — R3121 Asymptomatic microscopic hematuria: Secondary | ICD-10-CM | POA: Diagnosis not present

## 2019-05-07 DIAGNOSIS — N401 Enlarged prostate with lower urinary tract symptoms: Secondary | ICD-10-CM | POA: Diagnosis not present

## 2019-05-07 DIAGNOSIS — R351 Nocturia: Secondary | ICD-10-CM | POA: Diagnosis not present

## 2019-05-21 DIAGNOSIS — D485 Neoplasm of uncertain behavior of skin: Secondary | ICD-10-CM | POA: Diagnosis not present

## 2019-05-21 DIAGNOSIS — L720 Epidermal cyst: Secondary | ICD-10-CM | POA: Diagnosis not present

## 2019-05-25 DIAGNOSIS — Z0000 Encounter for general adult medical examination without abnormal findings: Secondary | ICD-10-CM | POA: Diagnosis not present

## 2019-05-25 DIAGNOSIS — N529 Male erectile dysfunction, unspecified: Secondary | ICD-10-CM | POA: Diagnosis not present

## 2019-05-25 DIAGNOSIS — I10 Essential (primary) hypertension: Secondary | ICD-10-CM | POA: Diagnosis not present

## 2019-05-25 DIAGNOSIS — E7800 Pure hypercholesterolemia, unspecified: Secondary | ICD-10-CM | POA: Diagnosis not present

## 2019-06-03 DIAGNOSIS — Z85828 Personal history of other malignant neoplasm of skin: Secondary | ICD-10-CM | POA: Diagnosis not present

## 2019-06-03 DIAGNOSIS — H02831 Dermatochalasis of right upper eyelid: Secondary | ICD-10-CM | POA: Diagnosis not present

## 2019-06-03 DIAGNOSIS — L720 Epidermal cyst: Secondary | ICD-10-CM | POA: Diagnosis not present

## 2019-06-03 DIAGNOSIS — H02834 Dermatochalasis of left upper eyelid: Secondary | ICD-10-CM | POA: Diagnosis not present

## 2019-08-04 ENCOUNTER — Other Ambulatory Visit: Payer: Self-pay | Admitting: Physician Assistant

## 2019-08-04 DIAGNOSIS — R1310 Dysphagia, unspecified: Secondary | ICD-10-CM

## 2019-08-11 ENCOUNTER — Other Ambulatory Visit: Payer: Medicare Other

## 2019-08-17 ENCOUNTER — Ambulatory Visit
Admission: RE | Admit: 2019-08-17 | Discharge: 2019-08-17 | Disposition: A | Discharge status: Home / Self Care | Payer: Medicare Other | Source: Ambulatory Visit | Attending: Physician Assistant | Admitting: Physician Assistant

## 2019-08-17 DIAGNOSIS — K449 Diaphragmatic hernia without obstruction or gangrene: Secondary | ICD-10-CM | POA: Diagnosis not present

## 2019-08-17 DIAGNOSIS — R1310 Dysphagia, unspecified: Secondary | ICD-10-CM

## 2019-08-17 DIAGNOSIS — K224 Dyskinesia of esophagus: Secondary | ICD-10-CM | POA: Diagnosis not present

## 2019-08-31 DIAGNOSIS — D120 Benign neoplasm of cecum: Secondary | ICD-10-CM | POA: Diagnosis not present

## 2019-08-31 DIAGNOSIS — Z1211 Encounter for screening for malignant neoplasm of colon: Secondary | ICD-10-CM | POA: Diagnosis not present

## 2019-08-31 DIAGNOSIS — K5730 Diverticulosis of large intestine without perforation or abscess without bleeding: Secondary | ICD-10-CM | POA: Diagnosis not present

## 2019-09-03 DIAGNOSIS — D120 Benign neoplasm of cecum: Secondary | ICD-10-CM | POA: Diagnosis not present

## 2019-09-22 ENCOUNTER — Encounter: Payer: Self-pay | Admitting: Family Medicine

## 2019-09-22 ENCOUNTER — Other Ambulatory Visit: Payer: Self-pay

## 2019-09-22 ENCOUNTER — Ambulatory Visit (INDEPENDENT_AMBULATORY_CARE_PROVIDER_SITE_OTHER): Payer: Medicare Other | Admitting: Family Medicine

## 2019-09-22 VITALS — Ht 66.0 in | Wt 124.0 lb

## 2019-09-22 DIAGNOSIS — M5416 Radiculopathy, lumbar region: Secondary | ICD-10-CM | POA: Insufficient documentation

## 2019-09-22 MED ORDER — PREDNISONE 5 MG PO TABS: ORAL_TABLET | ORAL | 0 refills | Status: DC

## 2019-09-22 NOTE — Patient Instructions (Signed)
Good to see you Please try heat  Please try the exercises  Physical therapy will give you a call   Please send me a message in MyChart with any questions or updates.  Please see me back in 2-3 weeks.   --Dr. Raeford Razor

## 2019-09-22 NOTE — Progress Notes (Signed)
Jaime Waters - 79 y.o. male MRN [id number]  Date of birth: Aug 29, 1940  SUBJECTIVE:  Including CC & ROS.  Chief Complaint  Patient presents with  . Back Pain    left-sided low back    Jaime Waters is a 79 y.o. male that is presenting with left leg pain.  Has been ongoing for about a month.  Denies any specific inciting event or trauma.  No history of similar pain.  Has tried over-the-counter medications with limited improvement.  No history of surgery.  Symptoms occur in the lateral aspect of the upper and lower leg.   Review of Systems See HPI   HISTORY: Past Medical, Surgical, Social, and Family History Reviewed & Updated per EMR.   Pertinent Historical Findings include:  Past Medical History:  Diagnosis Date  . Atypical mole 01/04/2004   mild- right ear rim  . Basal cell carcinoma 01/15/2016   left upper eyelid-TX MOHS  . BCC (basal cell carcinoma of skin) 10/27/1997   upper right back  . BCC (basal cell carcinoma) 08/08/1992   right sholder-tx cx40fu  . BCC (basal cell carcinoma) 10/28/1994   outer left eyebrow-tx Excision  . BCC (basal cell carcinoma) 04/12/1997   right inner forearm  . BCC (basal cell carcinoma) 04/17/2004   left inner upper back  . BCC (basal cell carcinoma) 02/08/2005   left forearm lower-TXPBX  . BCC (basal cell carcinoma) 07/08/2006   V of neck-tx Cx41fu  . BCC (basal cell carcinoma) 07/08/2006   top of right sholder-tx cx23fu  . BCC (basal cell carcinoma) 11/02/2008   left upper back  . BCC (basal cell carcinoma) 11/02/2008   left lower back  . BCC (basal cell carcinoma) 02/27/2011   left calf  . BCC (basal cell carcinoma), abdomen 03/04/2000   right back neck  . BCC (basal cell carcinoma), arm, left 05/13/1996   left upper chest  . Benign essential HTN   . Diverticulosis   . Hyperlipidemia   . Kidney stone   . Melanoma ([place]) 03/27/1992   left sholder  . Skin cancer 06/08/2015   right post neck-cx35fu+cautery  well diff scc  . Skin cancer 06/08/2015   right anterior neck-cx34fu+cautery CIS  . Skin cancer 06/08/2015   right inner shin-txpbx CIS  . Skin cancer 06/08/2015   left mid outer calf-txpbx CIS  . Skin cancer 07/13/2015   left upper cheek-txpbx CIS  . Skin cancer 10/02/2016   inf carcinoma-left inner eye-tx mohs   . Skin cancer 05/13/2017   right shin-cx42fu+cautery CIS  . Skin cancer 05/13/2017   left forearm superior-txpbx CIS  . Skin cancer 05/13/2017   left forearm medial-txpbx CIS  . Skin cancer 05/13/2017   left inner forearm-txpbx CIS  . Skin cancer 05/13/2017   right forearm superior CIS  . Skin cancer 05/13/2017   right forearm inferior-txpbx CIS  . Skin cancer 02/09/2018   front of left sideburn-cx39fu CIS  . Skin cancer 02/09/2018   left upper forearm-cx6fu CIS  . Skin cancer 02/09/2018   right forearm inner-cx55fu CIS  . Skin cancer 02/09/2018   right forearm outer-cx50fu CIS  . Skin cancer 07/01/2018   left front neck superior-scc well diff  . Skin cancer 07/01/2018   left inner collar bone-SCC WELL DIFF-txpbx  . Skin cancer 07/01/2018   right mid  shin inner-CIS-cx47fu  . Skin cancer 06/28/1997   right forearm- SCC  . Skin cancer 09/30/1997   right forearm-SCC  . Skin cancer  06/27/1998   left wrist-SCC  . Skin cancer 03/04/2000   left neck-SCC  . Skin cancer 07/28/2001   left first web space-SCC tx-cx52fu  . Skin cancer 09/01/2002   right knee-SCC  . Skin cancer 12/15/2006   right sholder-CIS-txpbx  . Skin cancer 05/28/2010   left thumb-cx3 CIS  . Skin cancer 11/02/2008   right hand-CIS  . Skin cancer 11/02/2008   right sideburn-CIS  . Skin cancer 02/27/2011   right calf-CIS  . Skin cancer 04/08/2013   left cheek-CIS  . Skin cancer 04/08/2013   mid chest-SCC  . Skin cancer 08/31/2014   mis chest-CIS  . Skin cancer 01/10/2015   left forearm CIS  . Skin cancer 01/10/2015   left calf-CIS  . Testicular atrophy     Past Surgical  History:  Procedure Laterality Date  . APPENDECTOMY      Family History  Problem Relation Age of Onset  . Dementia Mother   . Pancreatic cancer Father   . Hypertension Father   . Heart attack Neg Hx     Social History   Socioeconomic History  . Marital status: Married    Spouse name: Not on file  . Number of children: Not on file  . Years of education: Not on file  . Highest education level: Not on file  Occupational History  . Not on file  Tobacco Use  . Smoking status: Never Smoker  . Smokeless tobacco: Never Used  Vaping Use  . Vaping Use: Never used  Substance and Sexual Activity  . Alcohol use: Yes    Alcohol/week: 3.0 standard drinks    Types: 3 Glasses of wine per week  . Drug use: Never  . Sexual activity: Not on file  Other Topics Concern  . Not on file  Social History Narrative  . Not on file   Social Determinants of Health   Financial Resource Strain:   . Difficulty of Paying Living Expenses: Not on file  Food Insecurity:   . Worried About Charity fundraiser in the Last Year: Not on file  . Ran Out of Food in the Last Year: Not on file  Transportation Needs:   . Lack of Transportation (Medical): Not on file  . Lack of Transportation (Non-Medical): Not on file  Physical Activity:   . Days of Exercise per Week: Not on file  . Minutes of Exercise per Session: Not on file  Stress:   . Feeling of Stress : Not on file  Social Connections:   . Frequency of Communication with Friends and Family: Not on file  . Frequency of Social Gatherings with Friends and Family: Not on file  . Attends Religious Services: Not on file  . Active Member of Clubs or Organizations: Not on file  . Attends Archivist Meetings: Not on file  . Marital Status: Not on file  Intimate Partner Violence:   . Fear of Current or Ex-Partner: Not on file  . Emotionally Abused: Not on file  . Physically Abused: Not on file  . Sexually Abused: Not on file     PHYSICAL  EXAM:  VS: Ht 5\' 6"  (1.676 m)   Wt 124 lb (56.2 kg)   BMI 20.01 kg/m  Physical Exam Gen: NAD, alert, cooperative with exam, well-appearing MSK:  Back/left leg: No signs of atrophy. Normal flexion and extension. Normal external rotation. Some limited internal rotation of the hip. Negative straight leg raise. Neurovascularly intact     ASSESSMENT &  PLAN:   Lumbar radiculopathy Symptoms are suggestive of lumbar radiculopathy.  Has been ongoing for about a month.  No history of surgery or similar symptoms. -Counseled on home exercise therapy and supportive care. -Prednisone. -Referral physical therapy. -Could consider imaging.

## 2019-09-22 NOTE — Assessment & Plan Note (Signed)
Symptoms are suggestive of lumbar radiculopathy.  Has been ongoing for about a month.  No history of surgery or similar symptoms. -Counseled on home exercise therapy and supportive care. -Prednisone. -Referral physical therapy. -Could consider imaging.

## 2019-10-04 ENCOUNTER — Encounter: Payer: Self-pay | Admitting: Family Medicine

## 2019-10-11 DIAGNOSIS — M5417 Radiculopathy, lumbosacral region: Secondary | ICD-10-CM | POA: Diagnosis not present

## 2019-10-20 DIAGNOSIS — M5417 Radiculopathy, lumbosacral region: Secondary | ICD-10-CM | POA: Diagnosis not present

## 2019-10-22 DIAGNOSIS — M5417 Radiculopathy, lumbosacral region: Secondary | ICD-10-CM | POA: Diagnosis not present

## 2019-10-23 DIAGNOSIS — Z23 Encounter for immunization: Secondary | ICD-10-CM | POA: Diagnosis not present

## 2019-10-25 DIAGNOSIS — H35373 Puckering of macula, bilateral: Secondary | ICD-10-CM | POA: Diagnosis not present

## 2019-10-25 DIAGNOSIS — H353132 Nonexudative age-related macular degeneration, bilateral, intermediate dry stage: Secondary | ICD-10-CM | POA: Diagnosis not present

## 2019-10-27 DIAGNOSIS — M5417 Radiculopathy, lumbosacral region: Secondary | ICD-10-CM | POA: Diagnosis not present

## 2019-11-01 DIAGNOSIS — M5417 Radiculopathy, lumbosacral region: Secondary | ICD-10-CM | POA: Diagnosis not present

## 2019-11-08 DIAGNOSIS — M5417 Radiculopathy, lumbosacral region: Secondary | ICD-10-CM | POA: Diagnosis not present

## 2019-11-12 DIAGNOSIS — M5417 Radiculopathy, lumbosacral region: Secondary | ICD-10-CM | POA: Diagnosis not present

## 2019-11-15 ENCOUNTER — Encounter: Payer: Self-pay | Admitting: *Deleted

## 2019-11-15 ENCOUNTER — Encounter: Payer: Self-pay | Admitting: Dermatology

## 2019-11-15 ENCOUNTER — Other Ambulatory Visit: Payer: Self-pay

## 2019-11-15 ENCOUNTER — Ambulatory Visit (INDEPENDENT_AMBULATORY_CARE_PROVIDER_SITE_OTHER): Payer: Medicare Other | Admitting: Dermatology

## 2019-11-15 DIAGNOSIS — D0462 Carcinoma in situ of skin of left upper limb, including shoulder: Secondary | ICD-10-CM | POA: Diagnosis not present

## 2019-11-15 DIAGNOSIS — D0461 Carcinoma in situ of skin of right upper limb, including shoulder: Secondary | ICD-10-CM | POA: Diagnosis not present

## 2019-11-15 DIAGNOSIS — Z85828 Personal history of other malignant neoplasm of skin: Secondary | ICD-10-CM

## 2019-11-15 DIAGNOSIS — Z1283 Encounter for screening for malignant neoplasm of skin: Secondary | ICD-10-CM | POA: Diagnosis not present

## 2019-11-15 DIAGNOSIS — Z85820 Personal history of malignant melanoma of skin: Secondary | ICD-10-CM

## 2019-11-15 DIAGNOSIS — M5417 Radiculopathy, lumbosacral region: Secondary | ICD-10-CM | POA: Diagnosis not present

## 2019-11-15 DIAGNOSIS — C4492 Squamous cell carcinoma of skin, unspecified: Secondary | ICD-10-CM

## 2019-11-15 DIAGNOSIS — D485 Neoplasm of uncertain behavior of skin: Secondary | ICD-10-CM

## 2019-11-15 DIAGNOSIS — L570 Actinic keratosis: Secondary | ICD-10-CM | POA: Diagnosis not present

## 2019-11-15 DIAGNOSIS — Z8589 Personal history of malignant neoplasm of other organs and systems: Secondary | ICD-10-CM

## 2019-11-15 HISTORY — DX: Squamous cell carcinoma of skin, unspecified: C44.92

## 2019-11-15 NOTE — Patient Instructions (Addendum)
Use hydrocortisone ointment on forehead x 3 weeks and follow up with phone call or mychart message     Biopsy, Surgery (Curettage) & Surgery (Excision) Aftercare Instructions  1. Okay to remove bandage in 24 hours  2. Wash area with soap and water  3. Apply Vaseline to area twice daily until healed (Not Neosporin)  4. Okay to cover with a Band-Aid to decrease the chance of infection or prevent irritation from clothing; also it's okay to uncover lesion at home.  5. Suture instructions: return to our office in 7-10 or 10-14 days for a nurse visit for suture removal. Variable healing with sutures, if pain or itching occurs call our office. It's okay to shower or bathe 24 hours after sutures are given.  6. The following risks may occur after a biopsy, curettage or excision: bleeding, scarring, discoloration, recurrence, infection (redness, yellow drainage, pain or swelling).  7. For questions, concerns and results call our office at [place] before 4pm & Friday before 3pm. Biopsy results will be available in 1 week.

## 2019-11-17 DIAGNOSIS — Z23 Encounter for immunization: Secondary | ICD-10-CM | POA: Diagnosis not present

## 2019-11-18 ENCOUNTER — Telehealth: Payer: Self-pay

## 2019-11-18 NOTE — Telephone Encounter (Signed)
-----   Message from Lavonna Monarch, MD sent at 11/18/2019  6:16 AM EDT ----- Schedule surgery with Dr. Darene Lamer

## 2019-11-18 NOTE — Progress Notes (Addendum)
   Follow-Up Visit   Subjective  Jaime Waters is a 79 y.o. male who presents for the following: Annual Exam ([place] ON ARMS, NO REAL CONCERNS).  Annual skin exam Location:  Duration:  Quality:  Associated Signs/Symptoms: Modifying Factors:  Severity:  Timing: Context:   Objective  Well appearing patient in no apparent distress; mood and affect are within normal limits.  A full examination was performed including scalp, head, eyes, ears, nose, lips, neck, chest, axillae, abdomen, back, buttocks, bilateral upper extremities, bilateral lower extremities, hands, feet, fingers, toes, fingernails, and toenails. All findings within normal limits unless otherwise noted below.   Assessment & Plan    Personal history of malignant melanoma of skin Left Shoulder - Anterior  Annual skin check  History of squamous cell carcinoma Neck - Anterior  Annual skin check  History of basal cell cancer Left Supraorbital Region  Annual skin examination  Encounter for screening for malignant neoplasm of skin Right Breast  Yearly skin check  AK (actinic keratosis) (2) Left Forearm - Posterior; Left Forehead  Annual skin exam  Destruction of lesion - Left Forearm - Posterior, Left Forehead Complexity: simple   Destruction method: cryotherapy   Informed consent: discussed and consent obtained   Timeout:  patient name, date of birth, surgical site, and procedure verified Lesion destroyed using liquid nitrogen: Yes   Cryotherapy cycles:  5 Hemostasis achieved with:  pressure Outcome: patient tolerated procedure well with no complications    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, left (2) Right Thenar Eminence  Destruction of lesion Complexity: simple   Destruction method: electrodesiccation and curettage   Informed consent: discussed and consent obtained   Timeout:  patient name, date of birth, surgical site, and procedure  verified Anesthesia: the lesion was anesthetized in a standard fashion   Anesthetic:  1% lidocaine w/ epinephrine 1-100,000 local infiltration Curettage performed in three different directions: Yes   Curettage cycles:  3 Lesion length (cm):  0.8 Lesion width (cm):  0.7 Margin per side (cm):  0 Final wound size (cm):  0.8 Hemostasis achieved with:  ferric subsulfate Outcome: patient tolerated procedure well with no complications   Additional details:  Wound innoculated with 5 fluorouracil solution.  Specimen 1 - Surgical pathology Differential Diagnosis: bcc vs scc Check Margins: No  Right Forearm - Posterior  Destruction of lesion Complexity: simple   Destruction method: electrodesiccation and curettage   Informed consent: discussed and consent obtained   Timeout:  patient name, date of birth, surgical site, and procedure verified Anesthesia: the lesion was anesthetized in a standard fashion   Anesthetic:  1% lidocaine w/ epinephrine 1-100,000 local infiltration Curettage performed in three different directions: Yes   Curettage cycles:  3 Lesion length (cm):  0.8 Lesion width (cm):  0.8 Margin per side (cm):  0 Final wound size (cm):  0.8 Hemostasis achieved with:  ferric subsulfate Outcome: patient tolerated procedure well with no complications    Specimen 2 - Surgical pathology Differential Diagnosis: bcc vs scc Check Margins: No     I, Lavonna Monarch, MD, have reviewed all documentation for this visit.  The documentation on 01/02/20 for the exam, diagnosis, procedures, and orders are all accurate and complete.

## 2019-11-18 NOTE — Telephone Encounter (Signed)
Phone call to patient with his pathology results. Voicemail left for patient to give the office a call back.  ?

## 2019-11-19 DIAGNOSIS — M5417 Radiculopathy, lumbosacral region: Secondary | ICD-10-CM | POA: Diagnosis not present

## 2019-11-22 NOTE — Telephone Encounter (Signed)
Patient is returning telephone call about pathology results and next surgical appointment.

## 2019-11-24 DIAGNOSIS — M5417 Radiculopathy, lumbosacral region: Secondary | ICD-10-CM | POA: Diagnosis not present

## 2019-11-24 NOTE — Telephone Encounter (Signed)
-----   Message from Lavonna Monarch, MD sent at 11/18/2019  6:16 AM EDT ----- Schedule surgery with Dr. Darene Lamer

## 2019-11-24 NOTE — Telephone Encounter (Signed)
Phone call to patient with his pathology results. Patient aware of results.  

## 2019-11-26 DIAGNOSIS — M5417 Radiculopathy, lumbosacral region: Secondary | ICD-10-CM | POA: Diagnosis not present

## 2019-12-03 DIAGNOSIS — M5417 Radiculopathy, lumbosacral region: Secondary | ICD-10-CM | POA: Diagnosis not present

## 2019-12-24 ENCOUNTER — Encounter: Payer: Self-pay | Admitting: Dermatology

## 2020-01-02 NOTE — Addendum Note (Signed)
Addended by: Lavonna Monarch on: 01/02/2020 01:03 PM   Modules accepted: Orders

## 2020-01-25 ENCOUNTER — Encounter: Payer: Self-pay | Admitting: Dermatology

## 2020-01-25 ENCOUNTER — Ambulatory Visit (INDEPENDENT_AMBULATORY_CARE_PROVIDER_SITE_OTHER): Payer: Medicare Other | Admitting: Dermatology

## 2020-01-25 ENCOUNTER — Other Ambulatory Visit: Payer: Self-pay

## 2020-01-25 DIAGNOSIS — L570 Actinic keratosis: Secondary | ICD-10-CM

## 2020-01-25 DIAGNOSIS — D099 Carcinoma in situ, unspecified: Secondary | ICD-10-CM

## 2020-01-25 DIAGNOSIS — D0461 Carcinoma in situ of skin of right upper limb, including shoulder: Secondary | ICD-10-CM | POA: Diagnosis not present

## 2020-01-25 NOTE — Patient Instructions (Signed)

## 2020-01-27 ENCOUNTER — Other Ambulatory Visit: Payer: Self-pay

## 2020-01-27 ENCOUNTER — Ambulatory Visit (INDEPENDENT_AMBULATORY_CARE_PROVIDER_SITE_OTHER): Payer: Medicare Other | Admitting: Family Medicine

## 2020-01-27 ENCOUNTER — Ambulatory Visit (HOSPITAL_BASED_OUTPATIENT_CLINIC_OR_DEPARTMENT_OTHER)
Admission: RE | Admit: 2020-01-27 | Discharge: 2020-01-27 | Disposition: A | Discharge status: Home / Self Care | Payer: Medicare Other | Source: Ambulatory Visit | Attending: Family Medicine | Admitting: Family Medicine

## 2020-01-27 VITALS — BP 125/74 | Ht 66.0 in | Wt 120.0 lb

## 2020-01-27 DIAGNOSIS — M545 Low back pain, unspecified: Secondary | ICD-10-CM | POA: Diagnosis not present

## 2020-01-27 DIAGNOSIS — M5416 Radiculopathy, lumbar region: Secondary | ICD-10-CM | POA: Diagnosis not present

## 2020-01-27 MED ORDER — PREDNISONE 5 MG PO TABS: ORAL_TABLET | ORAL | 0 refills | Status: AC

## 2020-01-27 NOTE — Progress Notes (Signed)
Jaime Waters - 79 y.o. male MRN [id number]  Date of birth: 1940-08-13  SUBJECTIVE:  Including CC & ROS.  No chief complaint on file.   Jaime Waters is a 79 y.o. male that is presentation with altered sensation in his right lower leg and pain. Has a history of similar pain a few months ago. Has been through physical therapy and some of the pain more proximal has improved.    Review of Systems See HPI   HISTORY: Past Medical, Surgical, Social, and Family History Reviewed & Updated per EMR.   Pertinent Historical Findings include:  Past Medical History:  Diagnosis Date  . Atypical mole 01/04/2004   mild- right ear rim  . Basal cell carcinoma 01/15/2016   left upper eyelid-TX MOHS  . BCC (basal cell carcinoma of skin) 10/27/1997   upper right back  . BCC (basal cell carcinoma) 08/08/1992   right sholder-tx [id number]  . BCC (basal cell carcinoma) 10/28/1994   outer left eyebrow-tx Excision  . BCC (basal cell carcinoma) 04/12/1997   right inner forearm  . BCC (basal cell carcinoma) 04/17/2004   left inner upper back  . BCC (basal cell carcinoma) 02/08/2005   left forearm lower-TXPBX  . BCC (basal cell carcinoma) 07/08/2006   V of neck-tx [id number]  . BCC (basal cell carcinoma) 07/08/2006   top of right sholder-tx [id number]  . BCC (basal cell carcinoma) 11/02/2008   left upper back  . BCC (basal cell carcinoma) 11/02/2008   left lower back  . BCC (basal cell carcinoma) 01/15/2016   nod-left upper eyelid(MOHS)  . BCC (basal cell carcinoma), abdomen 03/04/2000   right back neck  . BCC (basal cell carcinoma), arm, left 05/13/1996   left upper chest  . Benign essential HTN   . Diverticulosis   . Hyperlipidemia   . Kidney stone   . Melanoma (HCC) 03/27/1992   left sholder  . SCCA (squamous cell carcinoma) of skin 11/15/2019   Right Thenar Eminence (tx p bx)  . SCCA (squamous cell carcinoma) of skin 11/15/2019   Right Forearm Posterior (curet and 5FU)  .  Skin cancer 06/08/2015   right post neck-[id number]+cautery well diff scc  . Skin cancer 06/08/2015   right anterior neck-[id number]+cautery CIS  . Skin cancer 06/08/2015   right inner shin-txpbx CIS  . Skin cancer 06/08/2015   left mid outer calf-txpbx CIS  . Skin cancer 07/13/2015   left upper cheek-txpbx CIS  . Skin cancer 10/02/2016   inf carcinoma-left inner eye-tx mohs   . Skin cancer 05/13/2017   right shin-[id number]+cautery CIS  . Skin cancer 05/13/2017   left forearm superior-txpbx CIS  . Skin cancer 05/13/2017   left forearm medial-txpbx CIS  . Skin cancer 05/13/2017   left inner forearm-txpbx CIS  . Skin cancer 05/13/2017   right forearm superior CIS  . Skin cancer 05/13/2017   right forearm inferior-txpbx CIS  . Skin cancer 02/09/2018   front of left sideburn-[id number] CIS  . Skin cancer 02/09/2018   left upper forearm-[id number] CIS  . Skin cancer 02/09/2018   right forearm inner-cx6135fu CIS  . Skin cancer 02/09/2018   right forearm outer-[id number] CIS  . Skin cancer 07/01/2018   left front neck superior-scc well diff  . Skin cancer 07/01/2018   left inner collar bone-SCC WELL DIFF-txpbx  . Skin cancer 07/01/2018   right mid  shin inner-CIS-[id number]  . Skin cancer 06/28/1997   right forearm- SCC  . Skin cancer 09/30/1997  right forearm-SCC  . Skin cancer 06/27/1998   left wrist-SCC  . Skin cancer 03/04/2000   left neck-SCC  . Skin cancer 07/28/2001   left first web space-SCC tx-cx51fu  . Skin cancer 09/01/2002   right knee-SCC  . Skin cancer 12/15/2006   right sholder-CIS-txpbx  . Skin cancer 05/28/2010   left thumb-cx3 CIS  . Skin cancer 11/02/2008   right hand-CIS  . Skin cancer 11/02/2008   right sideburn-CIS  . Skin cancer 02/27/2011   right calf-CIS  . Skin cancer 04/08/2013   left cheek-CIS  . Skin cancer 04/08/2013   mid chest-SCC  . Skin cancer 08/31/2014   mis chest-CIS  . Skin cancer 01/10/2015   left forearm CIS  . Skin cancer 01/10/2015   left  calf-CIS  . Squamous cell carcinoma of skin 06/28/1997   right forearm  . Squamous cell carcinoma of skin 10/27/1997   right forearm  . Squamous cell carcinoma of skin 06/27/1998   left wrist  . Squamous cell carcinoma of skin 03/04/2000   left neck  . Squamous cell carcinoma of skin 07/28/2001   left 1st webspace (CX35FU)  . Squamous cell carcinoma of skin 09/01/2002   right knee  . Squamous cell carcinoma of skin 12/15/2006   in situ-right shoulder(CX35FU)  . Squamous cell carcinoma of skin 05/28/2010   left htumb (CX35FU)  . Squamous cell carcinoma of skin 11/02/2008   right hand  . Squamous cell carcinoma of skin 11/02/2008   in situ-right sideburn  . Squamous cell carcinoma of skin 02/27/2011   in situ-right calf (txpbx)  . Squamous cell carcinoma of skin 06/13/2011   right ear rim (txpbx)  . Squamous cell carcinoma of skin 04/08/2013   in situ- left cheek (CX35FU)  . Squamous cell carcinoma of skin 04/08/2013   mid chest (CX35FU)  . Squamous cell carcinoma of skin 09/28/2014   in situ- mid chest (CX35FU)  . Squamous cell carcinoma of skin 01/28/2015   in situ-left forearm  . Squamous cell carcinoma of skin 01/28/2015   in situ- left calf  . Squamous cell carcinoma of skin 06/08/2015   well diff-right post neck (CX35FU)  . Squamous cell carcinoma of skin 06/08/2015   in situ-right ant neck (CX35FU)  . Squamous cell carcinoma of skin 06/08/2015   in situ- right inner shin (txpbx)  . Squamous cell carcinoma of skin 06/08/2015   in situ-left mid calf (txpbx)  . Squamous cell carcinoma of skin 07/13/2015   wel diff-left upper cheek (txpbx)  . Squamous cell carcinoma of skin 05/13/2017   in situ-right shin (CX35FU)  . Squamous cell carcinoma of skin 05/13/2017   in situ-left forearm superior (txpbx)  . Squamous cell carcinoma of skin 05/13/2017   in situ-left forearm-medial (txpbx)  . Squamous cell carcinoma of skin 05/13/2017   in situ- left inner forearm (txpbx)   . Squamous cell carcinoma of skin 05/13/2017   in situ- left inner forearm, sup (txpbx)  . Squamous cell carcinoma of skin 05/13/2017   in situ- right forearm, sup (txpbx)  . Squamous cell carcinoma of skin 05/13/2017   in situ- right forearm, inf (txpbx)  . Squamous cell carcinoma of skin 02/09/2018   in situ-front of sideburn (CX35FU)  . Squamous cell carcinoma of skin 02/09/2018   in situ-front of left sideburn (CX35FU)  . Squamous cell carcinoma of skin 02/09/2018   in situ-left upper forearm (CX3FU)  . Squamous cell carcinoma of skin 02/09/2018   in situ-right  forearm inner (CX35FU)  . Squamous cell carcinoma of skin 02/09/2018   in situ-right foearm, outer (CX35FU)  . Squamous cell carcinoma of skin 07/01/2018   well diff-left front neck, sup  . Squamous cell carcinoma of skin 07/01/2018   well diff-left inner collarbone (txpbx)  . Squamous cell carcinoma of skin 07/01/2018   in situ-right mid shin inner (Cx35FU)  . Testicular atrophy     Past Surgical History:  Procedure Laterality Date  . APPENDECTOMY      Family History  Problem Relation Age of Onset  . Dementia Mother   . Pancreatic cancer Father   . Hypertension Father   . Heart attack Neg Hx     Social History   Socioeconomic History  . Marital status: Married    Spouse name: Not on file  . Number of children: Not on file  . Years of education: Not on file  . Highest education level: Not on file  Occupational History  . Not on file  Tobacco Use  . Smoking status: Never Smoker  . Smokeless tobacco: Never Used  Vaping Use  . Vaping Use: Never used  Substance and Sexual Activity  . Alcohol use: Yes    Alcohol/week: 3.0 standard drinks    Types: 3 Glasses of wine per week  . Drug use: Never  . Sexual activity: Not on file  Other Topics Concern  . Not on file  Social History Narrative  . Not on file   Social Determinants of Health   Financial Resource Strain: Not on file  Food Insecurity:  Not on file  Transportation Needs: Not on file  Physical Activity: Not on file  Stress: Not on file  Social Connections: Not on file  Intimate Partner Violence: Not on file     PHYSICAL EXAM:  VS: BP 125/74   Ht 5\' 6"  (1.676 m)   Wt 120 lb (54.4 kg)   BMI 19.37 kg/m  Physical Exam Gen: NAD, alert, cooperative with exam, well-appearing MSK:  Right leg:  No atrophy  Normal strength to resistance  Normal hip flexion strength  Neurovascularly intact    ASSESSMENT & PLAN:   Lumbar radiculopathy Has symptoms suggestive of nerve impingement.  - counseled on home exercise therapy and supportive care - prednisone  - xray  - MRI lumbar spine to evaluate for nerve impingement.

## 2020-01-27 NOTE — Assessment & Plan Note (Signed)
Has symptoms suggestive of nerve impingement.  - counseled on home exercise therapy and supportive care - prednisone  - xray  - MRI lumbar spine to evaluate for nerve impingement.

## 2020-01-30 ENCOUNTER — Encounter: Payer: Self-pay | Admitting: Dermatology

## 2020-01-30 NOTE — Progress Notes (Signed)
   Follow-Up Visit   Subjective  Jaime Waters is a 80 y.o. male who presents for the following: Procedure (Patient here today for treatment CIS x 2 right thenar eminence and right forearm posterior.).  Skin cancer Location: Right arm Duration:  Quality:  Associated Signs/Symptoms: Modifying Factors:  Severity:  Timing: Context: Also several other crusts  Objective  Well appearing patient in no apparent distress; mood and affect are within normal limits. Objective  Right Forearm - Posterior: Lesion identified by Dr.Talin Feister and nurse in room.    Objective  Left Forearm - Posterior (4), Left Hand - Posterior (4): 2 to 5 mm pink hornlike crusts    A focused examination was performed including Head, neck, arms, hands.. Relevant physical exam findings are noted in the Assessment and Plan.   Assessment & Plan    Squamous cell carcinoma in situ Right Forearm - Posterior  Destruction of lesion Complexity: simple   Destruction method: electrodesiccation and curettage   Informed consent: discussed and consent obtained   Timeout:  patient name, date of birth, surgical site, and procedure verified Anesthesia: the lesion was anesthetized in a standard fashion   Anesthetic:  1% lidocaine w/ epinephrine 1-100,000 local infiltration Curettage performed in three different directions: Yes   Electrodesiccation performed over the curetted area: Yes   Curettage cycles:  3 Lesion length (cm):  1.2 Lesion width (cm):  1.2 Margin per side (cm):  0 Final wound size (cm):  1.2 Hemostasis achieved with:  ferric subsulfate and electrodesiccation Outcome: patient tolerated procedure well with no complications   Post-procedure details: sterile dressing applied and wound care instructions given   Dressing type: bandage and petrolatum   Additional details:  Wound innoculated with 5 fluorouracil solution.  AK (actinic keratosis) (8) Left Hand - Posterior (4); Left Forearm -  Posterior (4)  Destruction of lesion - Left Forearm - Posterior, Left Hand - Posterior Complexity: simple   Destruction method: cryotherapy   Informed consent: discussed and consent obtained   Timeout:  patient name, date of birth, surgical site, and procedure verified Lesion destroyed using liquid nitrogen: Yes   Cryotherapy cycles:  5 Outcome: patient tolerated procedure well with no complications   Post-procedure details: wound care instructions given        I, Janalyn Harder, MD, have reviewed all documentation for this visit.  The documentation on 01/30/20 for the exam, diagnosis, procedures, and orders are all accurate and complete.

## 2020-01-31 ENCOUNTER — Telehealth: Payer: Self-pay | Admitting: Family Medicine

## 2020-01-31 NOTE — Telephone Encounter (Signed)
Left VM for patient. If he calls back please have him speak with a nurse/CMA and inform that his xray shows concern for changes consistent with metastatic disease so we should continue with planned MRI.   If any questions then please take the best time and phone number to call and I will try to call him back.   Myra Rude, MD [facility] 01/31/2020, 8:27 AM

## 2020-02-01 ENCOUNTER — Ambulatory Visit
Admission: RE | Admit: 2020-02-01 | Discharge: 2020-02-01 | Disposition: A | Discharge status: Home / Self Care | Payer: Medicare Other | Source: Ambulatory Visit | Attending: Family Medicine | Admitting: Family Medicine

## 2020-02-01 ENCOUNTER — Encounter: Payer: Self-pay | Admitting: Family Medicine

## 2020-02-01 ENCOUNTER — Other Ambulatory Visit: Payer: Self-pay

## 2020-02-01 DIAGNOSIS — M5116 Intervertebral disc disorders with radiculopathy, lumbar region: Secondary | ICD-10-CM | POA: Diagnosis not present

## 2020-02-01 DIAGNOSIS — M5117 Intervertebral disc disorders with radiculopathy, lumbosacral region: Secondary | ICD-10-CM | POA: Diagnosis not present

## 2020-02-01 DIAGNOSIS — M5416 Radiculopathy, lumbar region: Secondary | ICD-10-CM

## 2020-02-01 DIAGNOSIS — M48061 Spinal stenosis, lumbar region without neurogenic claudication: Secondary | ICD-10-CM | POA: Diagnosis not present

## 2020-02-01 DIAGNOSIS — M4726 Other spondylosis with radiculopathy, lumbar region: Secondary | ICD-10-CM | POA: Diagnosis not present

## 2020-02-03 ENCOUNTER — Telehealth (INDEPENDENT_AMBULATORY_CARE_PROVIDER_SITE_OTHER): Payer: Medicare Other | Admitting: Family Medicine

## 2020-02-03 ENCOUNTER — Other Ambulatory Visit: Payer: Self-pay

## 2020-02-03 DIAGNOSIS — M5416 Radiculopathy, lumbar region: Secondary | ICD-10-CM | POA: Diagnosis not present

## 2020-02-03 NOTE — Assessment & Plan Note (Addendum)
Pain intermittent in nature.  Does have degenerative changes and foraminal narrowing.  Changes at L3 are likely benign. -Counseled on home exercise therapy and supportive care. -Epidural. -Would monitor with repeat MRI in 6 to 12 months given lesion at L3.

## 2020-02-03 NOTE — Progress Notes (Signed)
Virtual Visit via Video Note  I connected with Jaime Waters on 02/03/20 at  8:00 AM EST by a video enabled telemedicine application and verified that I am speaking with the correct person using two identifiers.  Location: Patient: home Provider: office   I discussed the limitations of evaluation and management by telemedicine and the availability of in person appointments. The patient expressed understanding and agreed to proceed.  History of Present Illness:  [name]  is a 80 yo M that is following up after his MRI of his lumbar spine. This was demonstrating neural foraminal narrowing at L4-5 and L5-S1. Changes at L3 are likely hemangioma.   Observations/Objective:  Gen: NAD, alert, cooperative with exam, well-appearing   Assessment and Plan:  Lumbar radiculopathy: Pain intermittent in nature.  Does have degenerative changes and foraminal narrowing.  Changes at L3 are likely benign. -Counseled on home exercise therapy and supportive care. -Epidural. -Would monitor with repeat MRI in 6 to 12 months given lesion at L3.  Follow Up Instructions:    I discussed the assessment and treatment plan with the patient. The patient was provided an opportunity to ask questions and all were answered. The patient agreed with the plan and demonstrated an understanding of the instructions.   The patient was advised to call back or seek an in-person evaluation if the symptoms worsen or if the condition fails to improve as anticipated.    Clare Gandy, MD

## 2020-02-11 ENCOUNTER — Telehealth: Payer: Self-pay | Admitting: Family Medicine

## 2020-02-11 NOTE — Telephone Encounter (Signed)
--  Pt ask Dr.Schmit to call him to discuss his upcoming Epidural on 02/15/20 @ [facility] [place] .   --forwarding message to provider.  -glh

## 2020-02-15 ENCOUNTER — Other Ambulatory Visit: Payer: Medicare Other

## 2020-02-23 ENCOUNTER — Other Ambulatory Visit: Payer: Medicare Other

## 2020-03-27 ENCOUNTER — Other Ambulatory Visit: Payer: Self-pay

## 2020-03-27 ENCOUNTER — Ambulatory Visit
Admission: RE | Admit: 2020-03-27 | Discharge: 2020-03-27 | Disposition: A | Discharge status: Home / Self Care | Payer: Medicare Other | Source: Ambulatory Visit | Attending: Family Medicine | Admitting: Family Medicine

## 2020-03-27 DIAGNOSIS — M5416 Radiculopathy, lumbar region: Secondary | ICD-10-CM

## 2020-03-27 DIAGNOSIS — M5116 Intervertebral disc disorders with radiculopathy, lumbar region: Secondary | ICD-10-CM | POA: Diagnosis not present

## 2020-03-27 MED ORDER — METHYLPREDNISOLONE ACETATE 40 MG/ML INJ SUSP (RADIOLOG
120.0000 mg | Freq: Once | INTRAMUSCULAR | Status: AC
  Administered 2020-03-27: 120 mg via EPIDURAL

## 2020-03-27 MED ORDER — IOPAMIDOL (ISOVUE-M 200) INJECTION 41%
1.0000 mL | Freq: Once | INTRAMUSCULAR | Status: AC
  Administered 2020-03-27: 1 mL via EPIDURAL

## 2020-03-27 NOTE — Discharge Instructions (Signed)

## 2020-04-24 ENCOUNTER — Encounter: Payer: Self-pay | Admitting: Dermatology

## 2020-04-24 ENCOUNTER — Other Ambulatory Visit: Payer: Self-pay

## 2020-04-24 ENCOUNTER — Ambulatory Visit (INDEPENDENT_AMBULATORY_CARE_PROVIDER_SITE_OTHER): Payer: Medicare Other | Admitting: Dermatology

## 2020-04-24 DIAGNOSIS — C44329 Squamous cell carcinoma of skin of other parts of face: Secondary | ICD-10-CM | POA: Diagnosis not present

## 2020-04-24 DIAGNOSIS — L570 Actinic keratosis: Secondary | ICD-10-CM

## 2020-04-24 DIAGNOSIS — D0422 Carcinoma in situ of skin of left ear and external auricular canal: Secondary | ICD-10-CM

## 2020-04-24 DIAGNOSIS — D485 Neoplasm of uncertain behavior of skin: Secondary | ICD-10-CM

## 2020-04-24 DIAGNOSIS — Z1283 Encounter for screening for malignant neoplasm of skin: Secondary | ICD-10-CM

## 2020-04-24 DIAGNOSIS — B079 Viral wart, unspecified: Secondary | ICD-10-CM

## 2020-04-24 DIAGNOSIS — Z85828 Personal history of other malignant neoplasm of skin: Secondary | ICD-10-CM | POA: Diagnosis not present

## 2020-04-24 DIAGNOSIS — C441292 Squamous cell carcinoma of skin of left lower eyelid, including canthus: Secondary | ICD-10-CM | POA: Diagnosis not present

## 2020-04-24 MED ORDER — IMIQUIMOD 5 % EX CREA: TOPICAL_CREAM | Freq: Every day | CUTANEOUS | 0 refills | Status: AC

## 2020-04-24 NOTE — Patient Instructions (Signed)

## 2020-04-25 DIAGNOSIS — H353132 Nonexudative age-related macular degeneration, bilateral, intermediate dry stage: Secondary | ICD-10-CM | POA: Diagnosis not present

## 2020-05-02 ENCOUNTER — Encounter: Payer: Self-pay | Admitting: Dermatology

## 2020-05-03 ENCOUNTER — Telehealth: Payer: Self-pay | Admitting: Dermatology

## 2020-05-03 NOTE — Progress Notes (Signed)
   Follow-Up Visit   Subjective  Jaime Waters is a 80 y.o. male who presents for the following: Follow-up (Follow up on scc. Lesion on left eye x weeks, tender to touch, scaly. Lesion on back of left thigh rough spot, calais like, x weeks.).  Several new lesions Location:  Duration:  Quality:  Associated Signs/Symptoms: Modifying Factors:  Severity:  Timing: Context: History of dozens of nonmelanoma skin cancers plus melanoma  Objective  Well appearing patient in no apparent distress; mood and affect are within normal limits. Objective  Left Abdomen (side) - Upper: Waist up skin check: No atypical pigmented lesions, no recurrence of melanoma or nonmobile skin cancers.  Possible new nonmelanoma skin cancers outside left eye and on left ear will be biopsied.  Objective  Left Superior ear: 28mm waxy pink crust, rule out SCCA.     Objective  Left Lateral eye: 14mm hornlike crust, possible SCCA     Objective  Left Dorsal Hand (2), Right Dorsal Hand (4): Hornlike pink 4 to 5 mm crusts  Objective  Left Thigh - Posterior: Slightly raised verrucous 4 mm pink papule    All skin waist up examined.  Plus legs.   Assessment & Plan    Screening exam for skin cancer Left Abdomen (side) - Upper  Yearly skin check  Neoplasm of uncertain behavior of skin (2) Left Superior ear  Skin / nail biopsy Type of biopsy: tangential   Informed consent: discussed and consent obtained   Timeout: patient name, date of birth, surgical site, and procedure verified   Procedure prep:  Patient was prepped and draped in usual sterile fashion (Non sterile) Prep type:  Chlorhexidine Anesthesia: the lesion was anesthetized in a standard fashion   Anesthetic:  1% lidocaine w/ epinephrine 1-100,000 local infiltration Instrument used: flexible razor blade   Outcome: patient tolerated procedure well   Post-procedure details: wound care instructions given    Specimen 1 - Surgical  pathology Differential Diagnosis: bcc vs scc  Check Margins: No  Left Lateral eye  Skin / nail biopsy Type of biopsy: tangential   Informed consent: discussed and consent obtained   Timeout: patient name, date of birth, surgical site, and procedure verified   Procedure prep:  Patient was prepped and draped in usual sterile fashion (Non sterile) Prep type:  Chlorhexidine Anesthesia: the lesion was anesthetized in a standard fashion   Anesthetic:  1% lidocaine w/ epinephrine 1-100,000 local infiltration Instrument used: flexible razor blade   Outcome: patient tolerated procedure well   Post-procedure details: wound care instructions given    Specimen 2 - Surgical pathology Differential Diagnosis: bcc vs scc  Check Margins: No  AK (actinic keratosis) (6) Left Dorsal Hand (2); Right Dorsal Hand (4)  Destruction of lesion - Left Dorsal Hand, Right Dorsal Hand Complexity: simple   Destruction method: cryotherapy   Informed consent: discussed and consent obtained   Timeout:  patient name, date of birth, surgical site, and procedure verified Lesion destroyed using liquid nitrogen: Yes   Cryotherapy cycles:  5 Outcome: patient tolerated procedure well with no complications   Post-procedure details: wound care instructions given    Ordered Medications: imiquimod (ALDARA) 5 % cream  Viral warts, unspecified type Left Thigh - Posterior  Leave if stable      I, Lavonna Monarch, MD, have reviewed all documentation for this visit.  The documentation on 05/03/20 for the exam, diagnosis, procedures, and orders are all accurate and complete.

## 2020-05-03 NOTE — Telephone Encounter (Signed)
Patient left message on office voice mail saying that he was calling for pathology results from last visit with Lavonna Monarch, MD.

## 2020-05-03 NOTE — Telephone Encounter (Signed)
Path to patient surgery made for may 19

## 2020-05-11 DIAGNOSIS — N402 Nodular prostate without lower urinary tract symptoms: Secondary | ICD-10-CM | POA: Diagnosis not present

## 2020-05-17 ENCOUNTER — Ambulatory Visit (INDEPENDENT_AMBULATORY_CARE_PROVIDER_SITE_OTHER): Payer: Medicare Other | Admitting: Dermatology

## 2020-05-17 ENCOUNTER — Other Ambulatory Visit: Payer: Self-pay

## 2020-05-17 ENCOUNTER — Encounter: Payer: Self-pay | Admitting: Dermatology

## 2020-05-17 ENCOUNTER — Telehealth: Payer: Self-pay | Admitting: *Deleted

## 2020-05-17 DIAGNOSIS — C44320 Squamous cell carcinoma of skin of unspecified parts of face: Secondary | ICD-10-CM

## 2020-05-17 DIAGNOSIS — D099 Carcinoma in situ, unspecified: Secondary | ICD-10-CM

## 2020-05-17 DIAGNOSIS — D0422 Carcinoma in situ of skin of left ear and external auricular canal: Secondary | ICD-10-CM

## 2020-05-17 DIAGNOSIS — C44329 Squamous cell carcinoma of skin of other parts of face: Secondary | ICD-10-CM

## 2020-05-17 NOTE — Telephone Encounter (Signed)
Patient here today for procedure - per dr tafeen left lateral canthus needs MOHS- referral done patient aware that they will be calling him to set up appointment.

## 2020-05-17 NOTE — Patient Instructions (Signed)

## 2020-05-19 DIAGNOSIS — N401 Enlarged prostate with lower urinary tract symptoms: Secondary | ICD-10-CM | POA: Diagnosis not present

## 2020-05-19 DIAGNOSIS — R351 Nocturia: Secondary | ICD-10-CM | POA: Diagnosis not present

## 2020-05-19 DIAGNOSIS — N402 Nodular prostate without lower urinary tract symptoms: Secondary | ICD-10-CM | POA: Diagnosis not present

## 2020-05-19 DIAGNOSIS — R3121 Asymptomatic microscopic hematuria: Secondary | ICD-10-CM | POA: Diagnosis not present

## 2020-05-19 DIAGNOSIS — N500 Atrophy of testis: Secondary | ICD-10-CM | POA: Diagnosis not present

## 2020-05-28 ENCOUNTER — Encounter: Payer: Self-pay | Admitting: Dermatology

## 2020-05-28 NOTE — Progress Notes (Signed)
   Follow-Up Visit   Subjective  Jaime Waters is a 80 y.o. male who presents for the following: Procedure (Here for treatment- left superior crus of antihelix & discuss treatment left lateral canthus -scc x 2).  CIS left ear Location:  Duration:  Quality:  Associated Signs/Symptoms: Modifying Factors:  Severity:  Timing: Context: For treatment  Objective  Well appearing patient in no apparent distress; mood and affect are within normal limits. Objective  Left Superior Crus of Antihelix: Lesion identified by Dr.Iyesha Waters and nurse in room.    Objective  Left Temple: Biopsy site examined   A focused examination was performed including And neck.. Relevant physical exam findings are noted in the Assessment and Plan.  Head also examined   Assessment & Plan    Squamous cell carcinoma in situ Left Superior Crus of Antihelix  Destruction of lesion Complexity: simple   Destruction method: electrodesiccation and curettage   Informed consent: discussed and consent obtained   Timeout:  patient name, date of birth, surgical site, and procedure verified Anesthesia: the lesion was anesthetized in a standard fashion   Anesthetic:  1% lidocaine w/ epinephrine 1-100,000 local infiltration Curettage performed in three different directions: Yes   Electrodesiccation performed over the curetted area: Yes   Curettage cycles:  3 Lesion length (cm):  2 Lesion width (cm):  2 Margin per side (cm):  0 Final wound size (cm):  2 Hemostasis achieved with:  ferric subsulfate Outcome: patient tolerated procedure well with no complications   Additional details:  Wound innoculated with 5 fluorouracil solution.  Squamous cell carcinoma of skin of face Left Temple  After discussion with Jaime Waters manner, we will refer him for Mohs surgery of this lesion.     I, Jaime Monarch, MD, have reviewed all documentation for this visit.  The documentation on 05/28/20 for the exam, diagnosis,  procedures, and orders are all accurate and complete.

## 2020-06-05 DIAGNOSIS — Z23 Encounter for immunization: Secondary | ICD-10-CM | POA: Diagnosis not present

## 2020-06-07 DIAGNOSIS — I10 Essential (primary) hypertension: Secondary | ICD-10-CM | POA: Diagnosis not present

## 2020-06-07 DIAGNOSIS — Z0000 Encounter for general adult medical examination without abnormal findings: Secondary | ICD-10-CM | POA: Diagnosis not present

## 2020-06-07 DIAGNOSIS — N402 Nodular prostate without lower urinary tract symptoms: Secondary | ICD-10-CM | POA: Diagnosis not present

## 2020-06-07 DIAGNOSIS — C44320 Squamous cell carcinoma of skin of unspecified parts of face: Secondary | ICD-10-CM | POA: Diagnosis not present

## 2020-06-15 ENCOUNTER — Encounter: Payer: Medicare Other | Admitting: Dermatology

## 2020-06-19 DIAGNOSIS — C441192 Basal cell carcinoma of skin of left lower eyelid, including canthus: Secondary | ICD-10-CM | POA: Diagnosis not present

## 2020-06-19 DIAGNOSIS — C441292 Squamous cell carcinoma of skin of left lower eyelid, including canthus: Secondary | ICD-10-CM | POA: Diagnosis not present

## 2020-07-24 ENCOUNTER — Other Ambulatory Visit: Payer: Self-pay

## 2020-07-24 ENCOUNTER — Encounter: Payer: Self-pay | Admitting: Dermatology

## 2020-07-24 ENCOUNTER — Ambulatory Visit (INDEPENDENT_AMBULATORY_CARE_PROVIDER_SITE_OTHER): Payer: Medicare Other | Admitting: Dermatology

## 2020-07-24 DIAGNOSIS — D044 Carcinoma in situ of skin of scalp and neck: Secondary | ICD-10-CM | POA: Diagnosis not present

## 2020-07-24 DIAGNOSIS — L570 Actinic keratosis: Secondary | ICD-10-CM

## 2020-07-24 NOTE — Patient Instructions (Signed)

## 2020-08-09 ENCOUNTER — Encounter: Payer: Self-pay | Admitting: Dermatology

## 2020-08-09 NOTE — Progress Notes (Signed)
Follow-Up Visit   Subjective  Jaime Waters is a 80 y.o. male who presents for the following: Follow-up (Lower back raised scale and itch but it has improved ).    Follow-Up Visit   Subjective  Jaime Waters is a 80 y.o. male who presents for the following: Follow-up (Lower back raised scale and itch but it has improved ).  New crusts on neck and back Location:  Duration:  Quality:  Associated Signs/Symptoms: Modifying Factors:  Severity:  Timing: Context:   Objective  Well appearing patient in no apparent distress; mood and affect are within normal limits. Left Posterior Neck, Right Lower Back Mildly inflamed 5 mm hornlike crusts  Right Posterior Neck 6 mm moderately thick hornlike pink crust compatible with superficial carcinoma        Assessment & Plan    AK (actinic keratosis) (2) Right Lower Back; Left Posterior Neck  Destruction of lesion - Left Posterior Neck, Right Lower Back Complexity: simple   Destruction method: cryotherapy   Informed consent: discussed and consent obtained   Timeout:  patient name, date of birth, surgical site, and procedure verified Lesion destroyed using liquid nitrogen: Yes   Cryotherapy cycles:  3 Outcome: patient tolerated procedure well with no complications   Post-procedure details: wound care instructions given    Related Medications imiquimod (ALDARA) 5 % cream Apply topically at bedtime.  Carcinoma in situ of skin of scalp and neck Right Posterior Neck  Skin / nail biopsy Type of biopsy: tangential   Informed consent: discussed and consent obtained   Timeout: patient name, date of birth, surgical site, and procedure verified   Anesthesia: the lesion was anesthetized in a standard fashion   Anesthetic:  1% lidocaine w/ epinephrine 1-100,000 local infiltration Instrument used: flexible razor blade   Hemostasis achieved with: ferric subsulfate   Outcome: patient tolerated procedure well    Post-procedure details: wound care instructions given    Destruction of lesion Complexity: simple   Destruction method: electrodesiccation and curettage   Informed consent: discussed and consent obtained   Timeout:  patient name, date of birth, surgical site, and procedure verified Anesthesia: the lesion was anesthetized in a standard fashion   Anesthetic:  1% lidocaine w/ epinephrine 1-100,000 local infiltration Curettage performed in three different directions: Yes   Curettage cycles:  3 Lesion length (cm):  0.6 Lesion width (cm):  0.6 Margin per side (cm):  0 Final wound size (cm):  0.6 Hemostasis achieved with:  aluminum chloride Outcome: patient tolerated procedure well with no complications   Post-procedure details: wound care instructions given    Specimen 1 - Surgical pathology Differential Diagnosis: [place] vs bcc  Check Margins: No  After shave biopsy the base of the lesion was treated with curettage and cautery.      I, Lavonna Monarch, MD, have reviewed all documentation for this visit.  The documentation on 08/09/20 for the exam, diagnosis, procedures, and orders are all accurate and complete.  Location:  Duration:  Quality:  Associated Signs/Symptoms: Modifying Factors:  Severity:  Timing: Context:   Objective  Well appearing patient in no apparent distress; mood and affect are within normal limits. Left Posterior Neck, Right Lower Back Mildly inflamed 5 mm hornlike crusts  Right Posterior Neck 6 mm moderately thick hornlike pink crust compatible with superficial carcinoma       All skin waist up examined.   Assessment & Plan    AK (actinic keratosis) (2) Right Lower Back; Left  Posterior Neck  Destruction of lesion - Left Posterior Neck, Right Lower Back Complexity: simple   Destruction method: cryotherapy   Informed consent: discussed and consent obtained   Timeout:  patient name, date of birth, surgical site, and procedure  verified Lesion destroyed using liquid nitrogen: Yes   Cryotherapy cycles:  3 Outcome: patient tolerated procedure well with no complications   Post-procedure details: wound care instructions given    Related Medications imiquimod (ALDARA) 5 % cream Apply topically at bedtime.  Carcinoma in situ of skin of scalp and neck Right Posterior Neck  Skin / nail biopsy Type of biopsy: tangential   Informed consent: discussed and consent obtained   Timeout: patient name, date of birth, surgical site, and procedure verified   Anesthesia: the lesion was anesthetized in a standard fashion   Anesthetic:  1% lidocaine w/ epinephrine 1-100,000 local infiltration Instrument used: flexible razor blade   Hemostasis achieved with: ferric subsulfate   Outcome: patient tolerated procedure well   Post-procedure details: wound care instructions given    Destruction of lesion Complexity: simple   Destruction method: electrodesiccation and curettage   Informed consent: discussed and consent obtained   Timeout:  patient name, date of birth, surgical site, and procedure verified Anesthesia: the lesion was anesthetized in a standard fashion   Anesthetic:  1% lidocaine w/ epinephrine 1-100,000 local infiltration Curettage performed in three different directions: Yes   Curettage cycles:  3 Lesion length (cm):  0.6 Lesion width (cm):  0.6 Margin per side (cm):  0 Final wound size (cm):  0.6 Hemostasis achieved with:  aluminum chloride Outcome: patient tolerated procedure well with no complications   Post-procedure details: wound care instructions given    Specimen 1 - Surgical pathology Differential Diagnosis: [place] vs bcc  Check Margins: No  After shave biopsy the base of the lesion was treated with curettage and cautery.      I, Lavonna Monarch, MD, have reviewed all documentation for this visit.  The documentation on 08/09/20 for the exam, diagnosis, procedures, and orders are all accurate and  complete.

## 2020-08-15 DIAGNOSIS — H18413 Arcus senilis, bilateral: Secondary | ICD-10-CM | POA: Diagnosis not present

## 2020-10-30 ENCOUNTER — Telehealth: Payer: Self-pay | Admitting: Family Medicine

## 2020-10-30 NOTE — Telephone Encounter (Signed)
Left VM for patient. If he calls back please have him speak with a nurse/CMA and inform that if his pain is the same then we can repeat epidural.  There was concern for a long process at L3 that we could repeat the MRI of the lumbar spine.   If any questions then please take the best time and phone number to call and I will try to call him back.   Rosemarie Ax, MD [facility] 10/30/2020, 1:20 PM

## 2020-10-30 NOTE — Telephone Encounter (Signed)
Pt cld states his Lumbar radiculopathy pain has returned & would like Dr. Raeford Razor to call him w/ what's next in trmt pln... another Epuidural??  --glh

## 2020-11-01 DIAGNOSIS — Z23 Encounter for immunization: Secondary | ICD-10-CM | POA: Diagnosis not present

## 2020-11-21 ENCOUNTER — Encounter: Payer: Self-pay | Admitting: Family Medicine

## 2020-11-22 ENCOUNTER — Other Ambulatory Visit: Payer: Self-pay | Admitting: Family Medicine

## 2020-11-22 MED ORDER — MELOXICAM 7.5 MG PO TABS: 7.5000 mg | ORAL_TABLET | Freq: Two times a day (BID) | ORAL | 1 refills | Status: AC | PRN

## 2020-11-29 DIAGNOSIS — Z23 Encounter for immunization: Secondary | ICD-10-CM | POA: Diagnosis not present

## 2020-12-18 ENCOUNTER — Ambulatory Visit (INDEPENDENT_AMBULATORY_CARE_PROVIDER_SITE_OTHER): Payer: Medicare Other | Admitting: Family Medicine

## 2020-12-18 VITALS — BP 126/70 | Ht 66.0 in | Wt 120.0 lb

## 2020-12-18 DIAGNOSIS — M5416 Radiculopathy, lumbar region: Secondary | ICD-10-CM | POA: Diagnosis not present

## 2020-12-18 NOTE — Assessment & Plan Note (Signed)
Pain is acutely occurring on the right side.  Previously has been down the left.  His MRI demonstrated left and right side pathology.  Showing moderate foraminal stenosis on the right at L5-S1. -Counseled on home exercise therapy and supportive care. -Epidural.

## 2020-12-18 NOTE — Progress Notes (Signed)
Jaime Waters - 80 y.o. male MRN [id number]  Date of birth: 07-30-1940  SUBJECTIVE:  Including CC & ROS.  No chief complaint on file.   Jaime Waters is a 80 y.o. male that is presenting with acute right-sided radicular pain.  Historically it has been mainly on the left.  Pain is worse with trunk flexion and is unable to touch his toes at this time.   Review of Systems See HPI   HISTORY: Past Medical, Surgical, Social, and Family History Reviewed & Updated per EMR.   Pertinent Historical Findings include:  Past Medical History:  Diagnosis Date   Atypical mole 01/04/2004   mild- right ear rim   Basal cell carcinoma 01/15/2016   left upper eyelid-TX MOHS   BCC (basal cell carcinoma of skin) 10/27/1997   upper right back   BCC (basal cell carcinoma) 08/08/1992   right sholder-tx cx1fu   BCC (basal cell carcinoma) 10/28/1994   outer left eyebrow-tx Excision   BCC (basal cell carcinoma) 04/12/1997   right inner forearm   BCC (basal cell carcinoma) 04/17/2004   left inner upper back   BCC (basal cell carcinoma) 02/08/2005   left forearm lower-TXPBX   BCC (basal cell carcinoma) 07/08/2006   V of neck-tx Cx34fu   BCC (basal cell carcinoma) 07/08/2006   top of right sholder-tx cx80fu   BCC (basal cell carcinoma) 11/02/2008   left upper back   BCC (basal cell carcinoma) 11/02/2008   left lower back   BCC (basal cell carcinoma) 01/15/2016   nod-left upper eyelid(MOHS)   BCC (basal cell carcinoma), abdomen 03/04/2000   right back neck   BCC (basal cell carcinoma), arm, left 05/13/1996   left upper chest   Benign essential HTN    Diverticulosis    Hyperlipidemia    Kidney stone    Melanoma ([place]) 03/27/1992   left sholder   SCCA (squamous cell carcinoma) of skin 11/15/2019   Right Thenar Emin[place] (tx p bx)   SCCA (squamous cell carcinoma) of skin 11/15/2019   Right Forearm Posterior (curet and 5FU)   Skin cancer 06/08/2015   right post  neck-cx49fu+cautery well diff scc   Skin cancer 06/08/2015   right anterior neck-cx91fu+cautery CIS   Skin cancer 06/08/2015   right inner shin-txpbx CIS   Skin cancer 06/08/2015   left mid outer calf-txpbx CIS   Skin cancer 07/13/2015   left upper cheek-txpbx CIS   Skin cancer 10/02/2016   inf carcinoma-left inner eye-tx mohs    Skin cancer 05/13/2017   right shin-cx81fu+cautery CIS   Skin cancer 05/13/2017   left forearm superior-txpbx CIS   Skin cancer 05/13/2017   left forearm medial-txpbx CIS   Skin cancer 05/13/2017   left inner forearm-txpbx CIS   Skin cancer 05/13/2017   right forearm superior CIS   Skin cancer 05/13/2017   right forearm inferior-txpbx CIS   Skin cancer 02/09/2018   front of left sideburn-cx16fu CIS   Skin cancer 02/09/2018   left upper forearm-cx56fu CIS   Skin cancer 02/09/2018   right forearm inner-cx84fu CIS   Skin cancer 02/09/2018   right forearm outer-cx66fu CIS   Skin cancer 07/01/2018   left front neck superior-scc well diff   Skin cancer 07/01/2018   left inner collar bone-SCC WELL DIFF-txpbx   Skin cancer 07/01/2018   right mid  shin inner-CIS-cx37fu   Skin cancer 06/28/1997   right forearm- SCC   Skin cancer 09/30/1997   right forearm-SCC  Skin cancer 06/27/1998   left wrist-SCC   Skin cancer 03/04/2000   left neck-SCC   Skin cancer 07/28/2001   left first web space-SCC tx-cx23fu   Skin cancer 09/01/2002   right knee-SCC   Skin cancer 12/15/2006   right sholder-CIS-txpbx   Skin cancer 05/28/2010   left thumb-cx3 CIS   Skin cancer 11/02/2008   right hand-CIS   Skin cancer 11/02/2008   right sideburn-CIS   Skin cancer 02/27/2011   right calf-CIS   Skin cancer 04/08/2013   left cheek-CIS   Skin cancer 04/08/2013   mid chest-SCC   Skin cancer 08/31/2014   mis chest-CIS   Skin cancer 01/10/2015   left forearm CIS   Skin cancer 01/10/2015   left calf-CIS   Squamous cell carcinoma of skin 06/28/1997   right forearm    Squamous cell carcinoma of skin 10/27/1997   right forearm   Squamous cell carcinoma of skin 06/27/1998   left wrist   Squamous cell carcinoma of skin 03/04/2000   left neck   Squamous cell carcinoma of skin 07/28/2001   left 1st webspace (CX35FU)   Squamous cell carcinoma of skin 09/01/2002   right knee   Squamous cell carcinoma of skin 12/15/2006   in situ-right shoulder(CX35FU)   Squamous cell carcinoma of skin 05/28/2010   left htumb (CX35FU)   Squamous cell carcinoma of skin 11/02/2008   right hand   Squamous cell carcinoma of skin 11/02/2008   in situ-right sideburn   Squamous cell carcinoma of skin 02/27/2011   in situ-right calf (txpbx)   Squamous cell carcinoma of skin 06/13/2011   right ear rim (txpbx)   Squamous cell carcinoma of skin 04/08/2013   in situ- left cheek (CX35FU)   Squamous cell carcinoma of skin 04/08/2013   mid chest (CX35FU)   Squamous cell carcinoma of skin 09/28/2014   in situ- mid chest (CX35FU)   Squamous cell carcinoma of skin 01/28/2015   in situ-left forearm   Squamous cell carcinoma of skin 01/28/2015   in situ- left calf   Squamous cell carcinoma of skin 06/08/2015   well diff-right post neck (CX35FU)   Squamous cell carcinoma of skin 06/08/2015   in situ-right ant neck (CX35FU)   Squamous cell carcinoma of skin 06/08/2015   in situ- right inner shin (txpbx)   Squamous cell carcinoma of skin 06/08/2015   in situ-left mid calf (txpbx)   Squamous cell carcinoma of skin 07/13/2015   wel diff-left upper cheek (txpbx)   Squamous cell carcinoma of skin 05/13/2017   in situ-right shin (CX35FU)   Squamous cell carcinoma of skin 05/13/2017   in situ-left forearm superior (txpbx)   Squamous cell carcinoma of skin 05/13/2017   in situ-left forearm-medial (txpbx)   Squamous cell carcinoma of skin 05/13/2017   in situ- left inner forearm (txpbx)   Squamous cell carcinoma of skin 05/13/2017   in situ- left inner forearm, sup (txpbx)    Squamous cell carcinoma of skin 05/13/2017   in situ- right forearm, sup (txpbx)   Squamous cell carcinoma of skin 05/13/2017   in situ- right forearm, inf (txpbx)   Squamous cell carcinoma of skin 02/09/2018   in situ-front of sideburn (CX35FU)   Squamous cell carcinoma of skin 02/09/2018   in situ-front of left sideburn (CX35FU)   Squamous cell carcinoma of skin 02/09/2018   in situ-left upper forearm (CX3FU)   Squamous cell carcinoma of skin 02/09/2018   in situ-right forearm inner (CX35FU)  Squamous cell carcinoma of skin 02/09/2018   in situ-right foearm, outer (CX35FU)   Squamous cell carcinoma of skin 07/01/2018   well diff-left front neck, sup   Squamous cell carcinoma of skin 07/01/2018   well diff-left inner collarbone (txpbx)   Squamous cell carcinoma of skin 07/01/2018   in situ-right mid shin inner (Cx35FU)   Testicular atrophy     Past Surgical History:  Procedure Laterality Date   APPENDECTOMY      Family History  Problem Relation Age of Onset   Dementia Mother    Pancreatic cancer Father    Hypertension Father    Heart attack Neg Hx     Social History   Socioeconomic History   Marital status: Married    Spouse name: Not on file   Number of children: Not on file   Years of education: Not on file   Highest education level: Not on file  Occupational History   Not on file  Tobacco Use   Smoking status: Never   Smokeless tobacco: Never  Vaping Use   Vaping Use: Never used  Substance and Sexual Activity   Alcohol use: Yes    Alcohol/week: 3.0 standard drinks    Types: 3 Glasses of wine per week   Drug use: Never   Sexual activity: Not on file  Other Topics Concern   Not on file  Social History Narrative   Not on file   Social Determinants of Health   Financial Resource Strain: Not on file  Food Insecurity: Not on file  Transportation Needs: Not on file  Physical Activity: Not on file  Stress: Not on file  Social Connections: Not on file   Intimate Partner Violence: Not on file     PHYSICAL EXAM:  VS: BP 126/70   Ht 5\' 6"  (1.676 m)   Wt 120 lb (54.4 kg)   BMI 19.37 kg/m  Physical Exam Gen: NAD, alert, cooperative with exam, well-appearing   ASSESSMENT & PLAN:   Lumbar radiculopathy Pain is acutely occurring on the right side.  Previously has been down the left.  His MRI demonstrated left and right side pathology.  Showing moderate foraminal stenosis on the right at L5-S1. -Counseled on home exercise therapy and supportive care. -Epidural.

## 2020-12-27 ENCOUNTER — Ambulatory Visit
Admission: RE | Admit: 2020-12-27 | Discharge: 2020-12-27 | Disposition: A | Discharge status: Home / Self Care | Payer: Medicare Other | Source: Ambulatory Visit | Attending: Family Medicine | Admitting: Family Medicine

## 2020-12-27 ENCOUNTER — Other Ambulatory Visit: Payer: Self-pay

## 2020-12-27 DIAGNOSIS — M5416 Radiculopathy, lumbar region: Secondary | ICD-10-CM | POA: Diagnosis not present

## 2020-12-27 MED ORDER — IOPAMIDOL (ISOVUE-M 200) INJECTION 41%
1.0000 mL | Freq: Once | INTRAMUSCULAR | Status: AC
  Administered 2020-12-27: 1 mL via EPIDURAL

## 2020-12-27 MED ORDER — METHYLPREDNISOLONE ACETATE 40 MG/ML INJ SUSP (RADIOLOG
80.0000 mg | Freq: Once | INTRAMUSCULAR | Status: AC
  Administered 2020-12-27: 80 mg via EPIDURAL

## 2020-12-27 NOTE — Discharge Instructions (Signed)

## 2021-01-23 ENCOUNTER — Ambulatory Visit (INDEPENDENT_AMBULATORY_CARE_PROVIDER_SITE_OTHER): Payer: Medicare Other | Admitting: Dermatology

## 2021-01-23 ENCOUNTER — Other Ambulatory Visit: Payer: Self-pay

## 2021-01-23 ENCOUNTER — Encounter: Payer: Self-pay | Admitting: Dermatology

## 2021-01-23 DIAGNOSIS — Z86018 Personal history of other benign neoplasm: Secondary | ICD-10-CM | POA: Diagnosis not present

## 2021-01-23 DIAGNOSIS — Z85828 Personal history of other malignant neoplasm of skin: Secondary | ICD-10-CM | POA: Diagnosis not present

## 2021-01-23 DIAGNOSIS — Z85820 Personal history of malignant melanoma of skin: Secondary | ICD-10-CM | POA: Diagnosis not present

## 2021-01-23 DIAGNOSIS — Z1283 Encounter for screening for malignant neoplasm of skin: Secondary | ICD-10-CM | POA: Diagnosis not present

## 2021-01-23 DIAGNOSIS — L570 Actinic keratosis: Secondary | ICD-10-CM | POA: Diagnosis not present

## 2021-02-01 ENCOUNTER — Other Ambulatory Visit: Payer: Self-pay | Admitting: Family Medicine

## 2021-02-01 ENCOUNTER — Encounter: Payer: Self-pay | Admitting: Family Medicine

## 2021-02-01 DIAGNOSIS — M5416 Radiculopathy, lumbar region: Secondary | ICD-10-CM

## 2021-02-18 ENCOUNTER — Encounter: Payer: Self-pay | Admitting: Dermatology

## 2021-02-18 NOTE — Progress Notes (Signed)
° °  Follow-Up Visit   Subjective  Jaime Waters is a 81 y.o. male who presents for the following: Follow-up (No new concerns. Patient has history of bcc, scc, mm, and atypia. Follow up ak right lower back.).  General skin examination, several new crusted spots Location:  Duration:  Quality:  Associated Signs/Symptoms: Modifying Factors:  Severity:  Timing: Context:   Objective  Well appearing patient in no apparent distress; mood and affect are within normal limits. Left Lower Leg - Anterior, Right Lower Leg - Anterior, Upper Body Skin Exam: No atypical pigmented lesions, no definite new or recurrent nonmelanoma skin cancer.  Left Dorsal Hand, Left Ear, Left Forearm - Anterior, Left Forehead (2), Left Scaphoid Fossa, Left Temple, Left Thenar [place], Right Dorsal Hand (2), Right Elbow - Posterior, Right Forearm - Anterior (2), Right Forehead Dozen Plus pink 2 to 5 mm gritty and hornlike crusts     A full examination was performed including scalp, head, eyes, ears, nose, lips, neck, chest, axillae, abdomen, back, buttocks, bilateral upper extremities, bilateral lower extremities, hands, feet, fingers, toes, fingernails, and toenails. All findings within normal limits unless otherwise noted below.  Areas beneath undergarments not fully examined.   Assessment & Plan    Screening exam for skin cancer (3) Left Lower Leg - Anterior; Right Lower Leg - Anterior; Upper Body  Annual skin examination, recheck surgical sites as needed change  Actinic keratosis (14) Left Ear; Right Elbow - Posterior; Left Forearm - Anterior; Right Forearm - Anterior (2); Left Dorsal Hand; Right Dorsal Hand (2); Left Thenar E[place]; Left Scaphoid Fossa; Left Temple; Left Forehead (2); Right Forehead  Destruction of lesion - Left Dorsal Hand, Left Ear, Left Forearm - Anterior, Left Forehead (2), Left Scaphoid Fossa, Left Temple, Left Thenar [place], Right Dorsal Hand (2), Right Elbow -  Posterior, Right Forearm - Anterior (2), Right Forehead Complexity: simple   Destruction method: cryotherapy   Informed consent: discussed and consent obtained   Timeout:  patient name, date of birth, surgical site, and procedure verified Lesion destroyed using liquid nitrogen: Yes   Cryotherapy cycles:  3 Outcome: patient tolerated procedure well with no complications   Post-procedure details: wound care instructions given        I, Lavonna Monarch, MD, have reviewed all documentation for this visit.  The documentation on 02/18/21 for the exam, diagnosis, procedures, and orders are all accurate and complete.

## 2021-04-04 DIAGNOSIS — M546 Pain in thoracic spine: Secondary | ICD-10-CM | POA: Diagnosis not present

## 2021-04-12 DIAGNOSIS — S233XXD Sprain of ligaments of thoracic spine, subsequent encounter: Secondary | ICD-10-CM | POA: Diagnosis not present

## 2021-05-15 DIAGNOSIS — R972 Elevated prostate specific antigen [PSA]: Secondary | ICD-10-CM | POA: Diagnosis not present

## 2021-05-23 DIAGNOSIS — N402 Nodular prostate without lower urinary tract symptoms: Secondary | ICD-10-CM | POA: Diagnosis not present

## 2021-05-23 DIAGNOSIS — N401 Enlarged prostate with lower urinary tract symptoms: Secondary | ICD-10-CM | POA: Diagnosis not present

## 2021-05-23 DIAGNOSIS — R3121 Asymptomatic microscopic hematuria: Secondary | ICD-10-CM | POA: Diagnosis not present

## 2021-05-23 DIAGNOSIS — R351 Nocturia: Secondary | ICD-10-CM | POA: Diagnosis not present

## 2021-05-23 DIAGNOSIS — N500 Atrophy of testis: Secondary | ICD-10-CM | POA: Diagnosis not present

## 2021-06-13 DIAGNOSIS — I10 Essential (primary) hypertension: Secondary | ICD-10-CM | POA: Diagnosis not present

## 2021-06-13 DIAGNOSIS — Z0000 Encounter for general adult medical examination without abnormal findings: Secondary | ICD-10-CM | POA: Diagnosis not present

## 2021-06-13 DIAGNOSIS — Z23 Encounter for immunization: Secondary | ICD-10-CM | POA: Diagnosis not present

## 2021-06-13 DIAGNOSIS — N402 Nodular prostate without lower urinary tract symptoms: Secondary | ICD-10-CM | POA: Diagnosis not present

## 2021-06-18 DIAGNOSIS — E7800 Pure hypercholesterolemia, unspecified: Secondary | ICD-10-CM | POA: Diagnosis not present

## 2021-06-18 DIAGNOSIS — I10 Essential (primary) hypertension: Secondary | ICD-10-CM | POA: Diagnosis not present

## 2021-08-09 DIAGNOSIS — M9903 Segmental and somatic dysfunction of lumbar region: Secondary | ICD-10-CM | POA: Diagnosis not present

## 2021-08-09 DIAGNOSIS — M25559 Pain in unspecified hip: Secondary | ICD-10-CM | POA: Diagnosis not present

## 2021-08-09 DIAGNOSIS — M9901 Segmental and somatic dysfunction of cervical region: Secondary | ICD-10-CM | POA: Diagnosis not present

## 2021-08-09 DIAGNOSIS — M9905 Segmental and somatic dysfunction of pelvic region: Secondary | ICD-10-CM | POA: Diagnosis not present

## 2021-08-09 DIAGNOSIS — M9906 Segmental and somatic dysfunction of lower extremity: Secondary | ICD-10-CM | POA: Diagnosis not present

## 2021-08-09 DIAGNOSIS — M9907 Segmental and somatic dysfunction of upper extremity: Secondary | ICD-10-CM | POA: Diagnosis not present

## 2021-08-09 DIAGNOSIS — M9904 Segmental and somatic dysfunction of sacral region: Secondary | ICD-10-CM | POA: Diagnosis not present

## 2021-08-09 DIAGNOSIS — M9908 Segmental and somatic dysfunction of rib cage: Secondary | ICD-10-CM | POA: Diagnosis not present

## 2021-08-09 DIAGNOSIS — M9902 Segmental and somatic dysfunction of thoracic region: Secondary | ICD-10-CM | POA: Diagnosis not present

## 2021-08-09 DIAGNOSIS — M9900 Segmental and somatic dysfunction of head region: Secondary | ICD-10-CM | POA: Diagnosis not present

## 2021-08-23 DIAGNOSIS — Z20822 Contact with and (suspected) exposure to covid-19: Secondary | ICD-10-CM | POA: Diagnosis not present

## 2021-09-05 DIAGNOSIS — M25552 Pain in left hip: Secondary | ICD-10-CM | POA: Diagnosis not present

## 2021-09-07 DIAGNOSIS — H2513 Age-related nuclear cataract, bilateral: Secondary | ICD-10-CM | POA: Diagnosis not present

## 2021-09-12 DIAGNOSIS — M25552 Pain in left hip: Secondary | ICD-10-CM | POA: Diagnosis not present

## 2021-09-17 DIAGNOSIS — M25552 Pain in left hip: Secondary | ICD-10-CM | POA: Diagnosis not present

## 2021-09-24 DIAGNOSIS — M25552 Pain in left hip: Secondary | ICD-10-CM | POA: Diagnosis not present

## 2021-10-03 DIAGNOSIS — M25552 Pain in left hip: Secondary | ICD-10-CM | POA: Diagnosis not present

## 2021-10-08 DIAGNOSIS — M25552 Pain in left hip: Secondary | ICD-10-CM | POA: Diagnosis not present

## 2021-10-11 DIAGNOSIS — L814 Other melanin hyperpigmentation: Secondary | ICD-10-CM | POA: Diagnosis not present

## 2021-10-11 DIAGNOSIS — C4441 Basal cell carcinoma of skin of scalp and neck: Secondary | ICD-10-CM | POA: Diagnosis not present

## 2021-10-11 DIAGNOSIS — L821 Other seborrheic keratosis: Secondary | ICD-10-CM | POA: Diagnosis not present

## 2021-10-11 DIAGNOSIS — Z85820 Personal history of malignant melanoma of skin: Secondary | ICD-10-CM | POA: Diagnosis not present

## 2021-10-11 DIAGNOSIS — L219 Seborrheic dermatitis, unspecified: Secondary | ICD-10-CM | POA: Diagnosis not present

## 2021-10-11 DIAGNOSIS — L570 Actinic keratosis: Secondary | ICD-10-CM | POA: Diagnosis not present

## 2021-10-16 DIAGNOSIS — M25552 Pain in left hip: Secondary | ICD-10-CM | POA: Diagnosis not present

## 2021-10-22 DIAGNOSIS — M25552 Pain in left hip: Secondary | ICD-10-CM | POA: Diagnosis not present

## 2021-10-29 DIAGNOSIS — M25552 Pain in left hip: Secondary | ICD-10-CM | POA: Diagnosis not present

## 2021-11-05 DIAGNOSIS — M25552 Pain in left hip: Secondary | ICD-10-CM | POA: Diagnosis not present

## 2021-11-10 DIAGNOSIS — Z23 Encounter for immunization: Secondary | ICD-10-CM | POA: Diagnosis not present

## 2021-11-16 DIAGNOSIS — M25552 Pain in left hip: Secondary | ICD-10-CM | POA: Diagnosis not present

## 2021-11-19 DIAGNOSIS — M79605 Pain in left leg: Secondary | ICD-10-CM | POA: Diagnosis not present

## 2021-11-19 DIAGNOSIS — M1612 Unilateral primary osteoarthritis, left hip: Secondary | ICD-10-CM | POA: Diagnosis not present

## 2021-11-20 DIAGNOSIS — H26492 Other secondary cataract, left eye: Secondary | ICD-10-CM | POA: Diagnosis not present

## 2021-11-20 DIAGNOSIS — H26493 Other secondary cataract, bilateral: Secondary | ICD-10-CM | POA: Diagnosis not present

## 2021-11-20 DIAGNOSIS — H35372 Puckering of macula, left eye: Secondary | ICD-10-CM | POA: Diagnosis not present

## 2021-11-20 DIAGNOSIS — H40013 Open angle with borderline findings, low risk, bilateral: Secondary | ICD-10-CM | POA: Diagnosis not present

## 2021-11-20 DIAGNOSIS — H18413 Arcus senilis, bilateral: Secondary | ICD-10-CM | POA: Diagnosis not present

## 2021-11-21 DIAGNOSIS — C4441 Basal cell carcinoma of skin of scalp and neck: Secondary | ICD-10-CM | POA: Diagnosis not present

## 2021-11-27 DIAGNOSIS — M1612 Unilateral primary osteoarthritis, left hip: Secondary | ICD-10-CM | POA: Diagnosis not present

## 2021-12-06 DIAGNOSIS — C4441 Basal cell carcinoma of skin of scalp and neck: Secondary | ICD-10-CM | POA: Diagnosis not present

## 2021-12-06 DIAGNOSIS — E785 Hyperlipidemia, unspecified: Secondary | ICD-10-CM | POA: Diagnosis not present

## 2021-12-06 DIAGNOSIS — M25559 Pain in unspecified hip: Secondary | ICD-10-CM | POA: Diagnosis not present

## 2021-12-06 DIAGNOSIS — I10 Essential (primary) hypertension: Secondary | ICD-10-CM | POA: Diagnosis not present

## 2021-12-06 DIAGNOSIS — Z01818 Encounter for other preprocedural examination: Secondary | ICD-10-CM | POA: Diagnosis not present

## 2021-12-07 DIAGNOSIS — Z23 Encounter for immunization: Secondary | ICD-10-CM | POA: Diagnosis not present

## 2021-12-12 DIAGNOSIS — L570 Actinic keratosis: Secondary | ICD-10-CM | POA: Diagnosis not present

## 2021-12-27 DIAGNOSIS — M25652 Stiffness of left hip, not elsewhere classified: Secondary | ICD-10-CM | POA: Diagnosis not present

## 2021-12-27 DIAGNOSIS — R262 Difficulty in walking, not elsewhere classified: Secondary | ICD-10-CM | POA: Diagnosis not present

## 2021-12-27 DIAGNOSIS — M1612 Unilateral primary osteoarthritis, left hip: Secondary | ICD-10-CM | POA: Diagnosis not present

## 2022-01-09 DIAGNOSIS — C439 Malignant melanoma of skin, unspecified: Secondary | ICD-10-CM | POA: Diagnosis not present

## 2022-01-09 DIAGNOSIS — M25552 Pain in left hip: Secondary | ICD-10-CM | POA: Diagnosis not present

## 2022-01-09 DIAGNOSIS — Z87442 Personal history of urinary calculi: Secondary | ICD-10-CM | POA: Diagnosis not present

## 2022-01-09 DIAGNOSIS — Z0000 Encounter for general adult medical examination without abnormal findings: Secondary | ICD-10-CM | POA: Diagnosis not present

## 2022-01-09 DIAGNOSIS — R972 Elevated prostate specific antigen [PSA]: Secondary | ICD-10-CM | POA: Diagnosis not present

## 2022-01-09 DIAGNOSIS — I10 Essential (primary) hypertension: Secondary | ICD-10-CM | POA: Diagnosis not present

## 2022-01-09 DIAGNOSIS — E785 Hyperlipidemia, unspecified: Secondary | ICD-10-CM | POA: Diagnosis not present

## 2022-01-09 DIAGNOSIS — M25559 Pain in unspecified hip: Secondary | ICD-10-CM | POA: Diagnosis not present

## 2022-01-09 DIAGNOSIS — M1612 Unilateral primary osteoarthritis, left hip: Secondary | ICD-10-CM | POA: Diagnosis not present

## 2022-01-18 DIAGNOSIS — M1612 Unilateral primary osteoarthritis, left hip: Secondary | ICD-10-CM | POA: Diagnosis not present

## 2022-01-18 DIAGNOSIS — Z96642 Presence of left artificial hip joint: Secondary | ICD-10-CM | POA: Diagnosis not present

## 2022-01-22 DIAGNOSIS — R531 Weakness: Secondary | ICD-10-CM | POA: Diagnosis not present

## 2022-01-22 DIAGNOSIS — R262 Difficulty in walking, not elsewhere classified: Secondary | ICD-10-CM | POA: Diagnosis not present

## 2022-01-22 DIAGNOSIS — Z96642 Presence of left artificial hip joint: Secondary | ICD-10-CM | POA: Diagnosis not present

## 2022-01-22 DIAGNOSIS — M25652 Stiffness of left hip, not elsewhere classified: Secondary | ICD-10-CM | POA: Diagnosis not present

## 2022-01-25 DIAGNOSIS — M25652 Stiffness of left hip, not elsewhere classified: Secondary | ICD-10-CM | POA: Diagnosis not present

## 2022-01-25 DIAGNOSIS — R262 Difficulty in walking, not elsewhere classified: Secondary | ICD-10-CM | POA: Diagnosis not present

## 2022-01-25 DIAGNOSIS — Z96642 Presence of left artificial hip joint: Secondary | ICD-10-CM | POA: Diagnosis not present

## 2022-01-25 DIAGNOSIS — R531 Weakness: Secondary | ICD-10-CM | POA: Diagnosis not present

## 2022-01-29 ENCOUNTER — Ambulatory Visit: Payer: Medicare Other | Admitting: Dermatology

## 2022-01-29 DIAGNOSIS — M25652 Stiffness of left hip, not elsewhere classified: Secondary | ICD-10-CM | POA: Diagnosis not present

## 2022-01-30 DIAGNOSIS — M25652 Stiffness of left hip, not elsewhere classified: Secondary | ICD-10-CM | POA: Diagnosis not present

## 2022-01-30 DIAGNOSIS — R262 Difficulty in walking, not elsewhere classified: Secondary | ICD-10-CM | POA: Diagnosis not present

## 2022-01-30 DIAGNOSIS — Z96642 Presence of left artificial hip joint: Secondary | ICD-10-CM | POA: Diagnosis not present

## 2022-01-30 DIAGNOSIS — R531 Weakness: Secondary | ICD-10-CM | POA: Diagnosis not present

## 2022-02-01 DIAGNOSIS — Z96642 Presence of left artificial hip joint: Secondary | ICD-10-CM | POA: Diagnosis not present

## 2022-02-01 DIAGNOSIS — R262 Difficulty in walking, not elsewhere classified: Secondary | ICD-10-CM | POA: Diagnosis not present

## 2022-02-01 DIAGNOSIS — R531 Weakness: Secondary | ICD-10-CM | POA: Diagnosis not present

## 2022-02-01 DIAGNOSIS — M25652 Stiffness of left hip, not elsewhere classified: Secondary | ICD-10-CM | POA: Diagnosis not present

## 2022-02-05 DIAGNOSIS — Z96642 Presence of left artificial hip joint: Secondary | ICD-10-CM | POA: Diagnosis not present

## 2022-02-05 DIAGNOSIS — M25652 Stiffness of left hip, not elsewhere classified: Secondary | ICD-10-CM | POA: Diagnosis not present

## 2022-02-05 DIAGNOSIS — R262 Difficulty in walking, not elsewhere classified: Secondary | ICD-10-CM | POA: Diagnosis not present

## 2022-02-05 DIAGNOSIS — R531 Weakness: Secondary | ICD-10-CM | POA: Diagnosis not present

## 2022-02-07 DIAGNOSIS — M25652 Stiffness of left hip, not elsewhere classified: Secondary | ICD-10-CM | POA: Diagnosis not present

## 2022-02-07 DIAGNOSIS — R262 Difficulty in walking, not elsewhere classified: Secondary | ICD-10-CM | POA: Diagnosis not present

## 2022-02-07 DIAGNOSIS — R531 Weakness: Secondary | ICD-10-CM | POA: Diagnosis not present

## 2022-02-07 DIAGNOSIS — Z96642 Presence of left artificial hip joint: Secondary | ICD-10-CM | POA: Diagnosis not present

## 2022-02-12 DIAGNOSIS — M25652 Stiffness of left hip, not elsewhere classified: Secondary | ICD-10-CM | POA: Diagnosis not present

## 2022-02-12 DIAGNOSIS — Z96642 Presence of left artificial hip joint: Secondary | ICD-10-CM | POA: Diagnosis not present

## 2022-02-12 DIAGNOSIS — R531 Weakness: Secondary | ICD-10-CM | POA: Diagnosis not present

## 2022-02-12 DIAGNOSIS — R262 Difficulty in walking, not elsewhere classified: Secondary | ICD-10-CM | POA: Diagnosis not present

## 2022-02-15 DIAGNOSIS — Z96642 Presence of left artificial hip joint: Secondary | ICD-10-CM | POA: Diagnosis not present

## 2022-02-15 DIAGNOSIS — M25652 Stiffness of left hip, not elsewhere classified: Secondary | ICD-10-CM | POA: Diagnosis not present

## 2022-02-15 DIAGNOSIS — R531 Weakness: Secondary | ICD-10-CM | POA: Diagnosis not present

## 2022-02-15 DIAGNOSIS — R262 Difficulty in walking, not elsewhere classified: Secondary | ICD-10-CM | POA: Diagnosis not present

## 2022-02-19 DIAGNOSIS — R262 Difficulty in walking, not elsewhere classified: Secondary | ICD-10-CM | POA: Diagnosis not present

## 2022-02-19 DIAGNOSIS — R531 Weakness: Secondary | ICD-10-CM | POA: Diagnosis not present

## 2022-02-19 DIAGNOSIS — Z96642 Presence of left artificial hip joint: Secondary | ICD-10-CM | POA: Diagnosis not present

## 2022-02-19 DIAGNOSIS — M25652 Stiffness of left hip, not elsewhere classified: Secondary | ICD-10-CM | POA: Diagnosis not present

## 2022-02-21 DIAGNOSIS — Z96642 Presence of left artificial hip joint: Secondary | ICD-10-CM | POA: Diagnosis not present

## 2022-02-21 DIAGNOSIS — R262 Difficulty in walking, not elsewhere classified: Secondary | ICD-10-CM | POA: Diagnosis not present

## 2022-02-21 DIAGNOSIS — R531 Weakness: Secondary | ICD-10-CM | POA: Diagnosis not present

## 2022-02-21 DIAGNOSIS — M25652 Stiffness of left hip, not elsewhere classified: Secondary | ICD-10-CM | POA: Diagnosis not present

## 2022-02-25 DIAGNOSIS — R262 Difficulty in walking, not elsewhere classified: Secondary | ICD-10-CM | POA: Diagnosis not present

## 2022-02-25 DIAGNOSIS — M25652 Stiffness of left hip, not elsewhere classified: Secondary | ICD-10-CM | POA: Diagnosis not present

## 2022-02-25 DIAGNOSIS — R531 Weakness: Secondary | ICD-10-CM | POA: Diagnosis not present

## 2022-02-25 DIAGNOSIS — Z96642 Presence of left artificial hip joint: Secondary | ICD-10-CM | POA: Diagnosis not present

## 2022-02-27 DIAGNOSIS — R262 Difficulty in walking, not elsewhere classified: Secondary | ICD-10-CM | POA: Diagnosis not present

## 2022-02-27 DIAGNOSIS — M25652 Stiffness of left hip, not elsewhere classified: Secondary | ICD-10-CM | POA: Diagnosis not present

## 2022-02-27 DIAGNOSIS — R531 Weakness: Secondary | ICD-10-CM | POA: Diagnosis not present

## 2022-02-27 DIAGNOSIS — Z96642 Presence of left artificial hip joint: Secondary | ICD-10-CM | POA: Diagnosis not present

## 2022-03-04 DIAGNOSIS — R531 Weakness: Secondary | ICD-10-CM | POA: Diagnosis not present

## 2022-03-04 DIAGNOSIS — R262 Difficulty in walking, not elsewhere classified: Secondary | ICD-10-CM | POA: Diagnosis not present

## 2022-03-04 DIAGNOSIS — Z96642 Presence of left artificial hip joint: Secondary | ICD-10-CM | POA: Diagnosis not present

## 2022-03-04 DIAGNOSIS — M25652 Stiffness of left hip, not elsewhere classified: Secondary | ICD-10-CM | POA: Diagnosis not present

## 2022-03-08 DIAGNOSIS — Z96642 Presence of left artificial hip joint: Secondary | ICD-10-CM | POA: Diagnosis not present

## 2022-03-08 DIAGNOSIS — M25652 Stiffness of left hip, not elsewhere classified: Secondary | ICD-10-CM | POA: Diagnosis not present

## 2022-03-08 DIAGNOSIS — R531 Weakness: Secondary | ICD-10-CM | POA: Diagnosis not present

## 2022-03-08 DIAGNOSIS — R262 Difficulty in walking, not elsewhere classified: Secondary | ICD-10-CM | POA: Diagnosis not present

## 2022-03-11 DIAGNOSIS — M25652 Stiffness of left hip, not elsewhere classified: Secondary | ICD-10-CM | POA: Diagnosis not present

## 2022-03-11 DIAGNOSIS — R262 Difficulty in walking, not elsewhere classified: Secondary | ICD-10-CM | POA: Diagnosis not present

## 2022-03-11 DIAGNOSIS — Z96642 Presence of left artificial hip joint: Secondary | ICD-10-CM | POA: Diagnosis not present

## 2022-03-11 DIAGNOSIS — R531 Weakness: Secondary | ICD-10-CM | POA: Diagnosis not present

## 2022-03-13 DIAGNOSIS — M25652 Stiffness of left hip, not elsewhere classified: Secondary | ICD-10-CM | POA: Diagnosis not present

## 2022-03-13 DIAGNOSIS — R262 Difficulty in walking, not elsewhere classified: Secondary | ICD-10-CM | POA: Diagnosis not present

## 2022-03-13 DIAGNOSIS — R531 Weakness: Secondary | ICD-10-CM | POA: Diagnosis not present

## 2022-03-13 DIAGNOSIS — Z96642 Presence of left artificial hip joint: Secondary | ICD-10-CM | POA: Diagnosis not present

## 2022-03-19 DIAGNOSIS — Z85828 Personal history of other malignant neoplasm of skin: Secondary | ICD-10-CM | POA: Diagnosis not present

## 2022-03-19 DIAGNOSIS — L821 Other seborrheic keratosis: Secondary | ICD-10-CM | POA: Diagnosis not present

## 2022-03-19 DIAGNOSIS — D2262 Melanocytic nevi of left upper limb, including shoulder: Secondary | ICD-10-CM | POA: Diagnosis not present

## 2022-03-19 DIAGNOSIS — D3612 Benign neoplasm of peripheral nerves and autonomic nervous system, upper limb, including shoulder: Secondary | ICD-10-CM | POA: Diagnosis not present

## 2022-03-19 DIAGNOSIS — C44519 Basal cell carcinoma of skin of other part of trunk: Secondary | ICD-10-CM | POA: Diagnosis not present

## 2022-03-19 DIAGNOSIS — D485 Neoplasm of uncertain behavior of skin: Secondary | ICD-10-CM | POA: Diagnosis not present

## 2022-03-19 DIAGNOSIS — L570 Actinic keratosis: Secondary | ICD-10-CM | POA: Diagnosis not present

## 2022-03-19 DIAGNOSIS — L814 Other melanin hyperpigmentation: Secondary | ICD-10-CM | POA: Diagnosis not present

## 2022-03-19 DIAGNOSIS — D225 Melanocytic nevi of trunk: Secondary | ICD-10-CM | POA: Diagnosis not present

## 2022-03-19 DIAGNOSIS — L910 Hypertrophic scar: Secondary | ICD-10-CM | POA: Diagnosis not present

## 2022-03-19 DIAGNOSIS — D1801 Hemangioma of skin and subcutaneous tissue: Secondary | ICD-10-CM | POA: Diagnosis not present

## 2022-03-26 DIAGNOSIS — M25652 Stiffness of left hip, not elsewhere classified: Secondary | ICD-10-CM | POA: Diagnosis not present

## 2022-04-08 DIAGNOSIS — R531 Weakness: Secondary | ICD-10-CM | POA: Diagnosis not present

## 2022-04-08 DIAGNOSIS — M25652 Stiffness of left hip, not elsewhere classified: Secondary | ICD-10-CM | POA: Diagnosis not present

## 2022-04-08 DIAGNOSIS — R262 Difficulty in walking, not elsewhere classified: Secondary | ICD-10-CM | POA: Diagnosis not present

## 2022-04-08 DIAGNOSIS — Z96642 Presence of left artificial hip joint: Secondary | ICD-10-CM | POA: Diagnosis not present

## 2022-05-13 ENCOUNTER — Encounter: Payer: Self-pay | Admitting: *Deleted

## 2022-06-18 DIAGNOSIS — M79661 Pain in right lower leg: Secondary | ICD-10-CM | POA: Diagnosis not present

## 2022-07-09 DIAGNOSIS — R972 Elevated prostate specific antigen [PSA]: Secondary | ICD-10-CM | POA: Diagnosis not present

## 2022-07-10 DIAGNOSIS — H26491 Other secondary cataract, right eye: Secondary | ICD-10-CM | POA: Diagnosis not present

## 2022-07-11 DIAGNOSIS — E785 Hyperlipidemia, unspecified: Secondary | ICD-10-CM | POA: Diagnosis not present

## 2022-07-11 DIAGNOSIS — I10 Essential (primary) hypertension: Secondary | ICD-10-CM | POA: Diagnosis not present

## 2022-07-11 DIAGNOSIS — Z125 Encounter for screening for malignant neoplasm of prostate: Secondary | ICD-10-CM | POA: Diagnosis not present

## 2022-07-15 DIAGNOSIS — N401 Enlarged prostate with lower urinary tract symptoms: Secondary | ICD-10-CM | POA: Diagnosis not present

## 2022-07-15 DIAGNOSIS — R351 Nocturia: Secondary | ICD-10-CM | POA: Diagnosis not present

## 2022-07-15 DIAGNOSIS — N500 Atrophy of testis: Secondary | ICD-10-CM | POA: Diagnosis not present

## 2022-07-15 DIAGNOSIS — N5201 Erectile dysfunction due to arterial insufficiency: Secondary | ICD-10-CM | POA: Diagnosis not present

## 2022-07-18 DIAGNOSIS — R82998 Other abnormal findings in urine: Secondary | ICD-10-CM | POA: Diagnosis not present

## 2022-07-18 DIAGNOSIS — R972 Elevated prostate specific antigen [PSA]: Secondary | ICD-10-CM | POA: Diagnosis not present

## 2022-07-18 DIAGNOSIS — Z1331 Encounter for screening for depression: Secondary | ICD-10-CM | POA: Diagnosis not present

## 2022-07-18 DIAGNOSIS — E785 Hyperlipidemia, unspecified: Secondary | ICD-10-CM | POA: Diagnosis not present

## 2022-07-18 DIAGNOSIS — M8580 Other specified disorders of bone density and structure, unspecified site: Secondary | ICD-10-CM | POA: Diagnosis not present

## 2022-07-18 DIAGNOSIS — Z0000 Encounter for general adult medical examination without abnormal findings: Secondary | ICD-10-CM | POA: Diagnosis not present

## 2022-07-18 DIAGNOSIS — C439 Malignant melanoma of skin, unspecified: Secondary | ICD-10-CM | POA: Diagnosis not present

## 2022-07-18 DIAGNOSIS — Z1339 Encounter for screening examination for other mental health and behavioral disorders: Secondary | ICD-10-CM | POA: Diagnosis not present

## 2022-07-18 DIAGNOSIS — I10 Essential (primary) hypertension: Secondary | ICD-10-CM | POA: Diagnosis not present

## 2022-07-18 DIAGNOSIS — Z87442 Personal history of urinary calculi: Secondary | ICD-10-CM | POA: Diagnosis not present

## 2022-08-27 DIAGNOSIS — M1612 Unilateral primary osteoarthritis, left hip: Secondary | ICD-10-CM | POA: Diagnosis not present

## 2022-08-27 DIAGNOSIS — M79661 Pain in right lower leg: Secondary | ICD-10-CM | POA: Diagnosis not present

## 2022-09-20 DIAGNOSIS — H18413 Arcus senilis, bilateral: Secondary | ICD-10-CM | POA: Diagnosis not present

## 2022-09-20 DIAGNOSIS — H40013 Open angle with borderline findings, low risk, bilateral: Secondary | ICD-10-CM | POA: Diagnosis not present

## 2022-09-20 DIAGNOSIS — Z961 Presence of intraocular lens: Secondary | ICD-10-CM | POA: Diagnosis not present

## 2022-09-20 DIAGNOSIS — H26491 Other secondary cataract, right eye: Secondary | ICD-10-CM | POA: Diagnosis not present

## 2022-09-23 DIAGNOSIS — H35373 Puckering of macula, bilateral: Secondary | ICD-10-CM | POA: Diagnosis not present

## 2022-09-24 DIAGNOSIS — D1801 Hemangioma of skin and subcutaneous tissue: Secondary | ICD-10-CM | POA: Diagnosis not present

## 2022-09-24 DIAGNOSIS — L570 Actinic keratosis: Secondary | ICD-10-CM | POA: Diagnosis not present

## 2022-09-24 DIAGNOSIS — Z85828 Personal history of other malignant neoplasm of skin: Secondary | ICD-10-CM | POA: Diagnosis not present

## 2022-09-24 DIAGNOSIS — D225 Melanocytic nevi of trunk: Secondary | ICD-10-CM | POA: Diagnosis not present

## 2022-09-24 DIAGNOSIS — L821 Other seborrheic keratosis: Secondary | ICD-10-CM | POA: Diagnosis not present

## 2022-10-18 DIAGNOSIS — Z23 Encounter for immunization: Secondary | ICD-10-CM | POA: Diagnosis not present

## 2023-01-02 DIAGNOSIS — Z85828 Personal history of other malignant neoplasm of skin: Secondary | ICD-10-CM | POA: Diagnosis not present

## 2023-01-02 DIAGNOSIS — C44319 Basal cell carcinoma of skin of other parts of face: Secondary | ICD-10-CM | POA: Diagnosis not present

## 2023-01-02 DIAGNOSIS — L738 Other specified follicular disorders: Secondary | ICD-10-CM | POA: Diagnosis not present

## 2023-01-16 DIAGNOSIS — M160 Bilateral primary osteoarthritis of hip: Secondary | ICD-10-CM | POA: Diagnosis not present

## 2023-01-16 DIAGNOSIS — M25511 Pain in right shoulder: Secondary | ICD-10-CM | POA: Diagnosis not present

## 2023-01-27 DIAGNOSIS — C44319 Basal cell carcinoma of skin of other parts of face: Secondary | ICD-10-CM | POA: Diagnosis not present

## 2023-01-27 DIAGNOSIS — Z85828 Personal history of other malignant neoplasm of skin: Secondary | ICD-10-CM | POA: Diagnosis not present

## 2023-01-31 DIAGNOSIS — E46 Unspecified protein-calorie malnutrition: Secondary | ICD-10-CM | POA: Diagnosis not present

## 2023-01-31 DIAGNOSIS — R7989 Other specified abnormal findings of blood chemistry: Secondary | ICD-10-CM | POA: Diagnosis not present

## 2023-01-31 DIAGNOSIS — H8110 Benign paroxysmal vertigo, unspecified ear: Secondary | ICD-10-CM | POA: Diagnosis not present

## 2023-01-31 DIAGNOSIS — E785 Hyperlipidemia, unspecified: Secondary | ICD-10-CM | POA: Diagnosis not present

## 2023-01-31 DIAGNOSIS — M8580 Other specified disorders of bone density and structure, unspecified site: Secondary | ICD-10-CM | POA: Diagnosis not present

## 2023-01-31 DIAGNOSIS — I10 Essential (primary) hypertension: Secondary | ICD-10-CM | POA: Diagnosis not present

## 2023-01-31 DIAGNOSIS — R972 Elevated prostate specific antigen [PSA]: Secondary | ICD-10-CM | POA: Diagnosis not present

## 2023-01-31 DIAGNOSIS — C439 Malignant melanoma of skin, unspecified: Secondary | ICD-10-CM | POA: Diagnosis not present

## 2023-03-08 DIAGNOSIS — J309 Allergic rhinitis, unspecified: Secondary | ICD-10-CM | POA: Diagnosis not present

## 2023-03-24 DIAGNOSIS — Z85828 Personal history of other malignant neoplasm of skin: Secondary | ICD-10-CM | POA: Diagnosis not present

## 2023-03-24 DIAGNOSIS — D0462 Carcinoma in situ of skin of left upper limb, including shoulder: Secondary | ICD-10-CM | POA: Diagnosis not present

## 2023-03-24 DIAGNOSIS — C44729 Squamous cell carcinoma of skin of left lower limb, including hip: Secondary | ICD-10-CM | POA: Diagnosis not present

## 2023-03-24 DIAGNOSIS — D485 Neoplasm of uncertain behavior of skin: Secondary | ICD-10-CM | POA: Diagnosis not present

## 2023-03-24 DIAGNOSIS — L570 Actinic keratosis: Secondary | ICD-10-CM | POA: Diagnosis not present

## 2023-03-24 DIAGNOSIS — L814 Other melanin hyperpigmentation: Secondary | ICD-10-CM | POA: Diagnosis not present

## 2023-03-24 DIAGNOSIS — L821 Other seborrheic keratosis: Secondary | ICD-10-CM | POA: Diagnosis not present

## 2023-04-21 DIAGNOSIS — M8588 Other specified disorders of bone density and structure, other site: Secondary | ICD-10-CM | POA: Diagnosis not present

## 2023-05-20 DIAGNOSIS — I10 Essential (primary) hypertension: Secondary | ICD-10-CM | POA: Diagnosis not present

## 2023-05-20 DIAGNOSIS — R42 Dizziness and giddiness: Secondary | ICD-10-CM | POA: Diagnosis not present

## 2023-05-20 DIAGNOSIS — H539 Unspecified visual disturbance: Secondary | ICD-10-CM | POA: Diagnosis not present

## 2023-05-20 DIAGNOSIS — E785 Hyperlipidemia, unspecified: Secondary | ICD-10-CM | POA: Diagnosis not present

## 2023-05-22 ENCOUNTER — Encounter: Payer: Self-pay | Admitting: Registered Nurse

## 2023-05-22 ENCOUNTER — Other Ambulatory Visit: Payer: Self-pay | Admitting: Registered Nurse

## 2023-05-22 DIAGNOSIS — H539 Unspecified visual disturbance: Secondary | ICD-10-CM

## 2023-05-22 DIAGNOSIS — R42 Dizziness and giddiness: Secondary | ICD-10-CM

## 2023-05-22 DIAGNOSIS — I10 Essential (primary) hypertension: Secondary | ICD-10-CM

## 2023-06-18 ENCOUNTER — Ambulatory Visit
Admission: RE | Admit: 2023-06-18 | Discharge: 2023-06-18 | Disposition: A | Discharge status: Home / Self Care | Source: Ambulatory Visit | Attending: Registered Nurse | Admitting: Registered Nurse

## 2023-06-18 DIAGNOSIS — G319 Degenerative disease of nervous system, unspecified: Secondary | ICD-10-CM | POA: Diagnosis not present

## 2023-06-18 DIAGNOSIS — R42 Dizziness and giddiness: Secondary | ICD-10-CM

## 2023-06-18 DIAGNOSIS — R519 Headache, unspecified: Secondary | ICD-10-CM | POA: Diagnosis not present

## 2023-06-18 DIAGNOSIS — I10 Essential (primary) hypertension: Secondary | ICD-10-CM

## 2023-06-18 DIAGNOSIS — H539 Unspecified visual disturbance: Secondary | ICD-10-CM | POA: Diagnosis not present

## 2023-06-18 DIAGNOSIS — G930 Cerebral cysts: Secondary | ICD-10-CM | POA: Diagnosis not present

## 2023-06-18 MED ORDER — GADOPICLENOL 0.5 MMOL/ML IV SOLN
5.0000 mL | Freq: Once | INTRAVENOUS | Status: AC | PRN
  Administered 2023-06-18: 5 mL via INTRAVENOUS

## 2023-06-30 DIAGNOSIS — D1801 Hemangioma of skin and subcutaneous tissue: Secondary | ICD-10-CM | POA: Diagnosis not present

## 2023-06-30 DIAGNOSIS — L570 Actinic keratosis: Secondary | ICD-10-CM | POA: Diagnosis not present

## 2023-06-30 DIAGNOSIS — L814 Other melanin hyperpigmentation: Secondary | ICD-10-CM | POA: Diagnosis not present

## 2023-06-30 DIAGNOSIS — D2261 Melanocytic nevi of right upper limb, including shoulder: Secondary | ICD-10-CM | POA: Diagnosis not present

## 2023-06-30 DIAGNOSIS — C44722 Squamous cell carcinoma of skin of right lower limb, including hip: Secondary | ICD-10-CM | POA: Diagnosis not present

## 2023-06-30 DIAGNOSIS — Z85828 Personal history of other malignant neoplasm of skin: Secondary | ICD-10-CM | POA: Diagnosis not present

## 2023-06-30 DIAGNOSIS — D2239 Melanocytic nevi of other parts of face: Secondary | ICD-10-CM | POA: Diagnosis not present

## 2023-06-30 DIAGNOSIS — L821 Other seborrheic keratosis: Secondary | ICD-10-CM | POA: Diagnosis not present

## 2023-07-15 DIAGNOSIS — M8580 Other specified disorders of bone density and structure, unspecified site: Secondary | ICD-10-CM | POA: Diagnosis not present

## 2023-07-15 DIAGNOSIS — I10 Essential (primary) hypertension: Secondary | ICD-10-CM | POA: Diagnosis not present

## 2023-07-15 DIAGNOSIS — R972 Elevated prostate specific antigen [PSA]: Secondary | ICD-10-CM | POA: Diagnosis not present

## 2023-07-15 DIAGNOSIS — E46 Unspecified protein-calorie malnutrition: Secondary | ICD-10-CM | POA: Diagnosis not present

## 2023-07-15 DIAGNOSIS — E785 Hyperlipidemia, unspecified: Secondary | ICD-10-CM | POA: Diagnosis not present

## 2023-07-15 DIAGNOSIS — N401 Enlarged prostate with lower urinary tract symptoms: Secondary | ICD-10-CM | POA: Diagnosis not present

## 2023-07-15 DIAGNOSIS — R7989 Other specified abnormal findings of blood chemistry: Secondary | ICD-10-CM | POA: Diagnosis not present

## 2023-07-18 DIAGNOSIS — N401 Enlarged prostate with lower urinary tract symptoms: Secondary | ICD-10-CM | POA: Diagnosis not present

## 2023-07-18 DIAGNOSIS — N5201 Erectile dysfunction due to arterial insufficiency: Secondary | ICD-10-CM | POA: Diagnosis not present

## 2023-07-18 DIAGNOSIS — N402 Nodular prostate without lower urinary tract symptoms: Secondary | ICD-10-CM | POA: Diagnosis not present

## 2023-07-18 DIAGNOSIS — R351 Nocturia: Secondary | ICD-10-CM | POA: Diagnosis not present

## 2023-07-21 DIAGNOSIS — Z0000 Encounter for general adult medical examination without abnormal findings: Secondary | ICD-10-CM | POA: Diagnosis not present

## 2023-07-21 DIAGNOSIS — R7989 Other specified abnormal findings of blood chemistry: Secondary | ICD-10-CM | POA: Diagnosis not present

## 2023-07-21 DIAGNOSIS — Z1339 Encounter for screening examination for other mental health and behavioral disorders: Secondary | ICD-10-CM | POA: Diagnosis not present

## 2023-07-21 DIAGNOSIS — C439 Malignant melanoma of skin, unspecified: Secondary | ICD-10-CM | POA: Diagnosis not present

## 2023-07-21 DIAGNOSIS — Z1331 Encounter for screening for depression: Secondary | ICD-10-CM | POA: Diagnosis not present

## 2023-07-21 DIAGNOSIS — H8110 Benign paroxysmal vertigo, unspecified ear: Secondary | ICD-10-CM | POA: Diagnosis not present

## 2023-07-21 DIAGNOSIS — I10 Essential (primary) hypertension: Secondary | ICD-10-CM | POA: Diagnosis not present

## 2023-07-21 DIAGNOSIS — M8580 Other specified disorders of bone density and structure, unspecified site: Secondary | ICD-10-CM | POA: Diagnosis not present

## 2023-07-21 DIAGNOSIS — Z87442 Personal history of urinary calculi: Secondary | ICD-10-CM | POA: Diagnosis not present

## 2023-07-21 DIAGNOSIS — E785 Hyperlipidemia, unspecified: Secondary | ICD-10-CM | POA: Diagnosis not present

## 2023-07-21 DIAGNOSIS — R972 Elevated prostate specific antigen [PSA]: Secondary | ICD-10-CM | POA: Diagnosis not present

## 2023-07-26 IMAGING — XA Imaging study
2 series · 2 of 2 positions shown · non-contrast
Comparison: none

CLINICAL DATA: Right lower extremity lumbar radiculopathy.

[Series 1: ortho standard · 1 of 1 slices shown (1 of 2)]
[im 1/1]
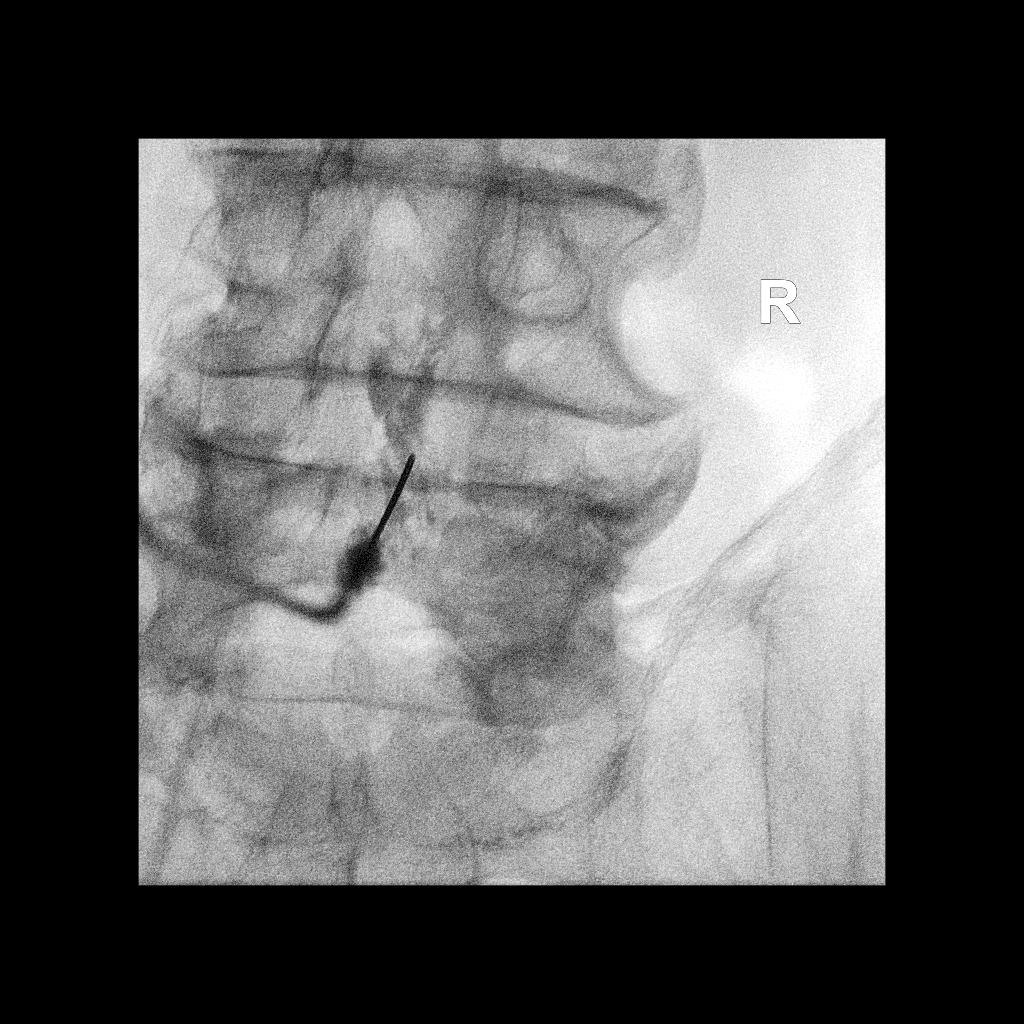

[Series 2: ortho standard · 1 of 1 slices shown (2 of 2)]
[im 1/1]
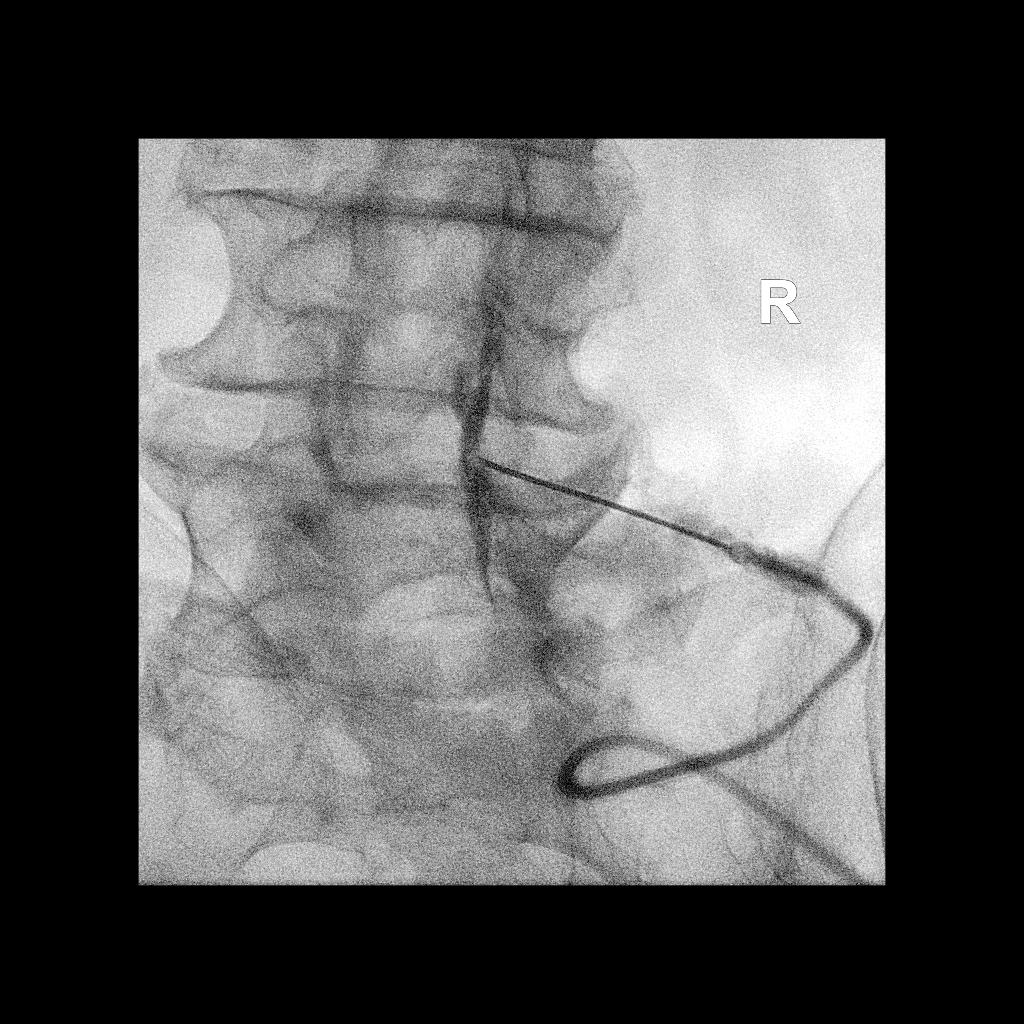

[2 of 2 positions shown; findings below may reference images not displayed]

FLUOROSCOPY TIME:  Radiation Exposure Index (as provided by the
fluoroscopic device): 14.21 uGy*m2

PROCEDURE:
The procedure, risks, benefits, and alternatives were explained to
the patient. Questions regarding the procedure were encouraged and
answered. The patient understands and consents to the procedure.

LUMBAR EPIDURAL INJECTION:

An interlaminar approach was performed on the right at L4-5. The
overlying skin was cleansed and anesthetized. A 20 gauge epidural
needle was advanced using loss-of-resistance technique.

DIAGNOSTIC EPIDURAL INJECTION:

Injection of Isovue-M 200 shows a good epidural pattern with spread
above and below the level of needle placement, primarily on the
right no vascular opacification is seen.

THERAPEUTIC EPIDURAL INJECTION:

Eighty mg of Depo-Medrol mixed with 1.5 mL 1% lidocaine were
instilled. The procedure was well-tolerated, and the patient was
discharged thirty minutes following the injection in good condition.

COMPLICATIONS:
None
IMPRESSION: Technically successful epidural injection on the right L4-5 # 1

## 2023-08-05 ENCOUNTER — Other Ambulatory Visit: Payer: Self-pay | Admitting: Internal Medicine

## 2023-08-05 DIAGNOSIS — E785 Hyperlipidemia, unspecified: Secondary | ICD-10-CM

## 2023-09-03 ENCOUNTER — Ambulatory Visit
Admission: RE | Admit: 2023-09-03 | Discharge: 2023-09-03 | Disposition: A | Discharge status: Home / Self Care | Source: Ambulatory Visit | Attending: Internal Medicine | Admitting: Internal Medicine

## 2023-09-03 DIAGNOSIS — E785 Hyperlipidemia, unspecified: Secondary | ICD-10-CM

## 2023-09-04 DIAGNOSIS — R269 Unspecified abnormalities of gait and mobility: Secondary | ICD-10-CM | POA: Diagnosis not present

## 2023-09-04 DIAGNOSIS — M5430 Sciatica, unspecified side: Secondary | ICD-10-CM | POA: Diagnosis not present

## 2023-09-22 DIAGNOSIS — R269 Unspecified abnormalities of gait and mobility: Secondary | ICD-10-CM | POA: Diagnosis not present

## 2023-09-22 DIAGNOSIS — M5430 Sciatica, unspecified side: Secondary | ICD-10-CM | POA: Diagnosis not present

## 2023-09-22 DIAGNOSIS — L03221 Cellulitis of neck: Secondary | ICD-10-CM | POA: Diagnosis not present

## 2023-10-06 DIAGNOSIS — H353132 Nonexudative age-related macular degeneration, bilateral, intermediate dry stage: Secondary | ICD-10-CM | POA: Diagnosis not present

## 2023-10-08 DIAGNOSIS — M5430 Sciatica, unspecified side: Secondary | ICD-10-CM | POA: Diagnosis not present

## 2023-10-08 DIAGNOSIS — R269 Unspecified abnormalities of gait and mobility: Secondary | ICD-10-CM | POA: Diagnosis not present

## 2023-10-27 DIAGNOSIS — M5430 Sciatica, unspecified side: Secondary | ICD-10-CM | POA: Diagnosis not present

## 2023-10-27 DIAGNOSIS — R269 Unspecified abnormalities of gait and mobility: Secondary | ICD-10-CM | POA: Diagnosis not present

## 2023-11-05 DIAGNOSIS — C439 Malignant melanoma of skin, unspecified: Secondary | ICD-10-CM | POA: Diagnosis not present

## 2023-11-05 DIAGNOSIS — R1319 Other dysphagia: Secondary | ICD-10-CM | POA: Diagnosis not present

## 2023-11-05 DIAGNOSIS — E785 Hyperlipidemia, unspecified: Secondary | ICD-10-CM | POA: Diagnosis not present

## 2023-11-05 DIAGNOSIS — Z87442 Personal history of urinary calculi: Secondary | ICD-10-CM | POA: Diagnosis not present

## 2023-11-05 DIAGNOSIS — H8110 Benign paroxysmal vertigo, unspecified ear: Secondary | ICD-10-CM | POA: Diagnosis not present

## 2023-11-05 DIAGNOSIS — M25519 Pain in unspecified shoulder: Secondary | ICD-10-CM | POA: Diagnosis not present

## 2023-11-05 DIAGNOSIS — I10 Essential (primary) hypertension: Secondary | ICD-10-CM | POA: Diagnosis not present

## 2023-11-05 DIAGNOSIS — M8580 Other specified disorders of bone density and structure, unspecified site: Secondary | ICD-10-CM | POA: Diagnosis not present

## 2023-11-05 DIAGNOSIS — R972 Elevated prostate specific antigen [PSA]: Secondary | ICD-10-CM | POA: Diagnosis not present

## 2023-11-05 DIAGNOSIS — R7989 Other specified abnormal findings of blood chemistry: Secondary | ICD-10-CM | POA: Diagnosis not present

## 2023-11-05 DIAGNOSIS — R0683 Snoring: Secondary | ICD-10-CM | POA: Diagnosis not present

## 2023-11-10 DIAGNOSIS — M5430 Sciatica, unspecified side: Secondary | ICD-10-CM | POA: Diagnosis not present

## 2023-11-10 DIAGNOSIS — R269 Unspecified abnormalities of gait and mobility: Secondary | ICD-10-CM | POA: Diagnosis not present

## 2023-11-10 DIAGNOSIS — Z23 Encounter for immunization: Secondary | ICD-10-CM | POA: Diagnosis not present

## 2023-11-12 DIAGNOSIS — L578 Other skin changes due to chronic exposure to nonionizing radiation: Secondary | ICD-10-CM | POA: Diagnosis not present

## 2023-11-12 DIAGNOSIS — C44722 Squamous cell carcinoma of skin of right lower limb, including hip: Secondary | ICD-10-CM | POA: Diagnosis not present

## 2023-11-12 DIAGNOSIS — Z85828 Personal history of other malignant neoplasm of skin: Secondary | ICD-10-CM | POA: Diagnosis not present

## 2023-11-12 DIAGNOSIS — L820 Inflamed seborrheic keratosis: Secondary | ICD-10-CM | POA: Diagnosis not present

## 2023-11-12 DIAGNOSIS — D0472 Carcinoma in situ of skin of left lower limb, including hip: Secondary | ICD-10-CM | POA: Diagnosis not present

## 2023-11-12 DIAGNOSIS — L821 Other seborrheic keratosis: Secondary | ICD-10-CM | POA: Diagnosis not present

## 2023-11-12 DIAGNOSIS — D1801 Hemangioma of skin and subcutaneous tissue: Secondary | ICD-10-CM | POA: Diagnosis not present

## 2023-11-12 DIAGNOSIS — L570 Actinic keratosis: Secondary | ICD-10-CM | POA: Diagnosis not present

## 2023-11-12 DIAGNOSIS — B078 Other viral warts: Secondary | ICD-10-CM | POA: Diagnosis not present

## 2023-11-12 DIAGNOSIS — D0462 Carcinoma in situ of skin of left upper limb, including shoulder: Secondary | ICD-10-CM | POA: Diagnosis not present

## 2023-11-17 ENCOUNTER — Other Ambulatory Visit: Payer: Self-pay | Admitting: Nurse Practitioner

## 2023-11-17 DIAGNOSIS — Z23 Encounter for immunization: Secondary | ICD-10-CM | POA: Diagnosis not present

## 2023-11-17 DIAGNOSIS — R1310 Dysphagia, unspecified: Secondary | ICD-10-CM

## 2023-12-01 DIAGNOSIS — R269 Unspecified abnormalities of gait and mobility: Secondary | ICD-10-CM | POA: Diagnosis not present

## 2023-12-01 DIAGNOSIS — M5430 Sciatica, unspecified side: Secondary | ICD-10-CM | POA: Diagnosis not present

## 2023-12-20 DIAGNOSIS — R2241 Localized swelling, mass and lump, right lower limb: Secondary | ICD-10-CM | POA: Diagnosis not present

## 2024-01-01 ENCOUNTER — Ambulatory Visit
Admission: RE | Admit: 2024-01-01 | Discharge: 2024-01-01 | Disposition: A | Source: Ambulatory Visit | Attending: Nurse Practitioner | Admitting: Nurse Practitioner

## 2024-01-01 DIAGNOSIS — R1310 Dysphagia, unspecified: Secondary | ICD-10-CM

## 2024-01-01 DIAGNOSIS — K224 Dyskinesia of esophagus: Secondary | ICD-10-CM | POA: Diagnosis not present

## 2024-01-02 DIAGNOSIS — C44722 Squamous cell carcinoma of skin of right lower limb, including hip: Secondary | ICD-10-CM | POA: Diagnosis not present

## 2024-01-02 DIAGNOSIS — Z85828 Personal history of other malignant neoplasm of skin: Secondary | ICD-10-CM | POA: Diagnosis not present
